# Patient Record
Sex: Female | Born: 1955 | Race: Black or African American | Hispanic: No | Marital: Married | Smoking: Never smoker
Health system: Southern US, Community
[De-identification: ages and names within clinical notes are randomized; demographics above are authoritative.]

## PROBLEM LIST (undated history)

## (undated) DIAGNOSIS — R202 Paresthesia of skin: Secondary | ICD-10-CM

## (undated) HISTORY — PX: NO PAST SURGERIES: SHX2092

## (undated) HISTORY — DX: Paresthesia of skin: R20.2

---

## 2004-09-06 ENCOUNTER — Other Ambulatory Visit: Admission: RE | Admit: 2004-09-06 | Discharge: 2004-09-06 | Payer: Self-pay | Admitting: Obstetrics and Gynecology

## 2004-09-14 ENCOUNTER — Ambulatory Visit: Payer: Self-pay | Admitting: Family Medicine

## 2004-09-14 ENCOUNTER — Ambulatory Visit (HOSPITAL_COMMUNITY): Admission: RE | Admit: 2004-09-14 | Discharge: 2004-09-14 | Payer: Self-pay | Admitting: Obstetrics and Gynecology

## 2004-09-14 IMAGING — US US SOFT TISSUE HEAD/NECK
1 series · 14 of 25 positions shown · non-contrast
Comparison: None.

CLINICAL DATA: Patient has enlarged thyroid gland.
 THYROID ULTRASOUND:
TECHNIQUE: Ultrasound examination of the thyroid gland and adjacent soft tissue structures was performed.

[Series 1: unknown · 0.12mm/px · 14 of 49 slices shown]
[im 1/49]
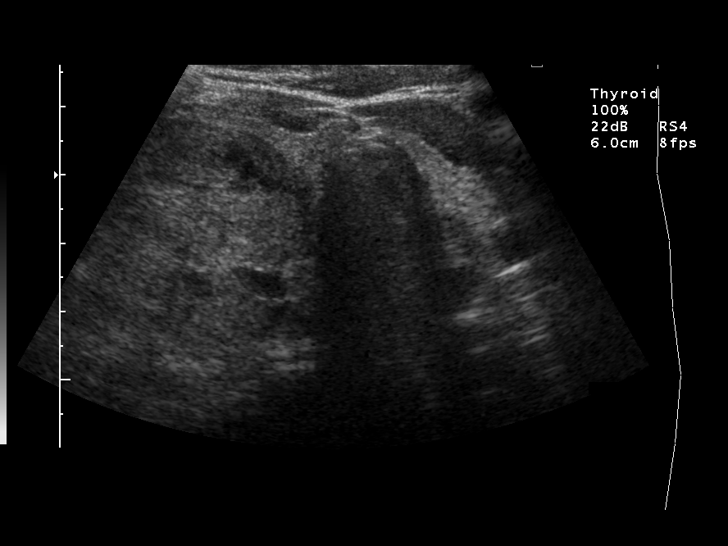
[im 5/49]
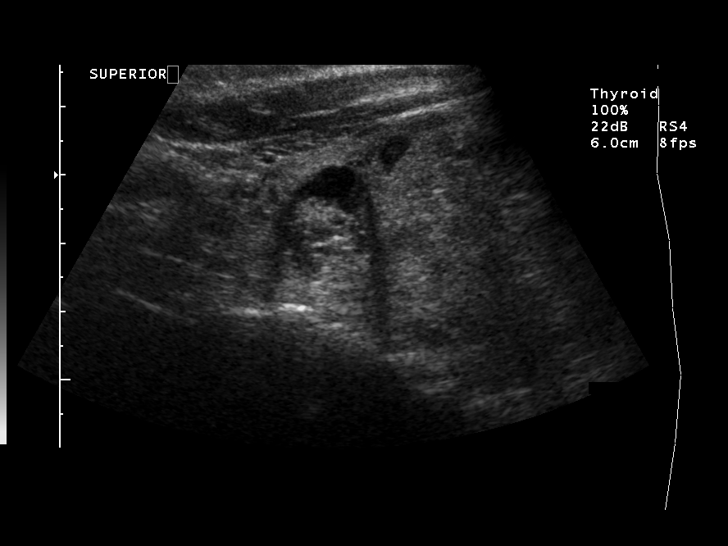
[im 9/49]
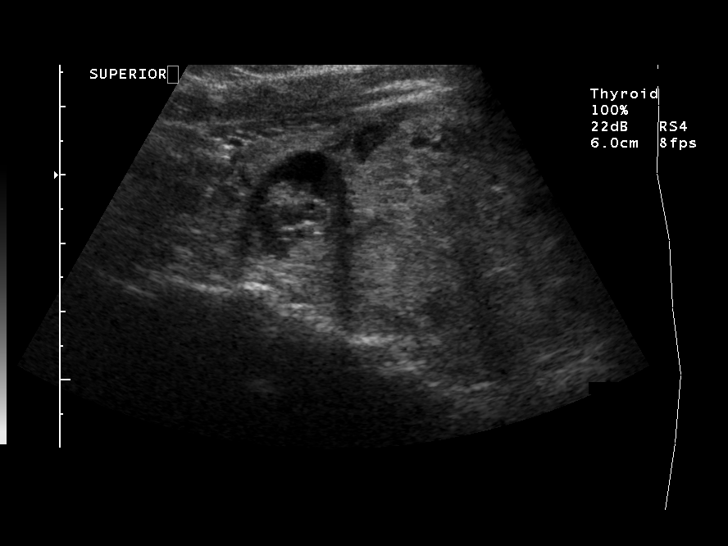
[im 13/49]
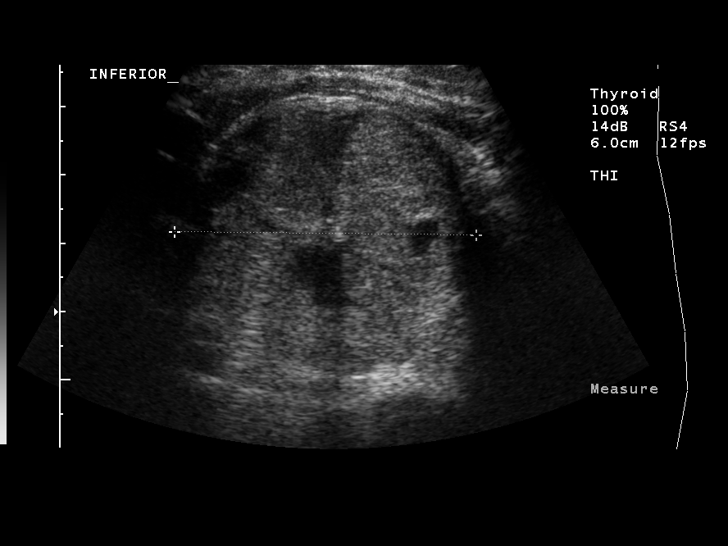
[im 17/49]
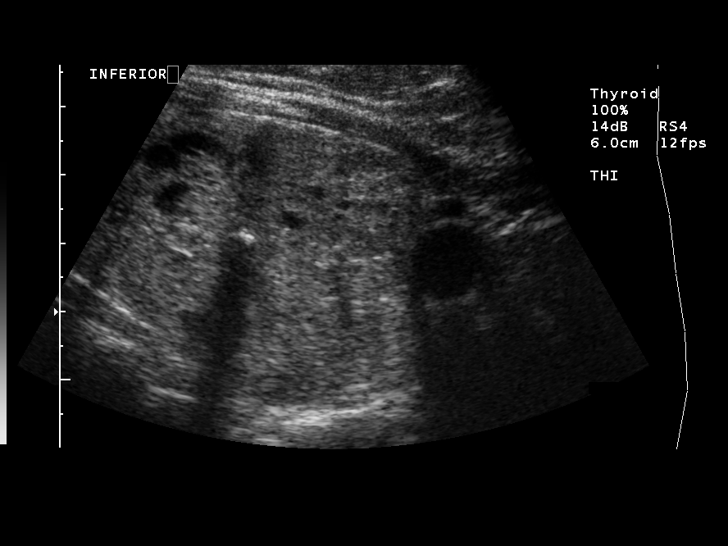
[im 19/49]
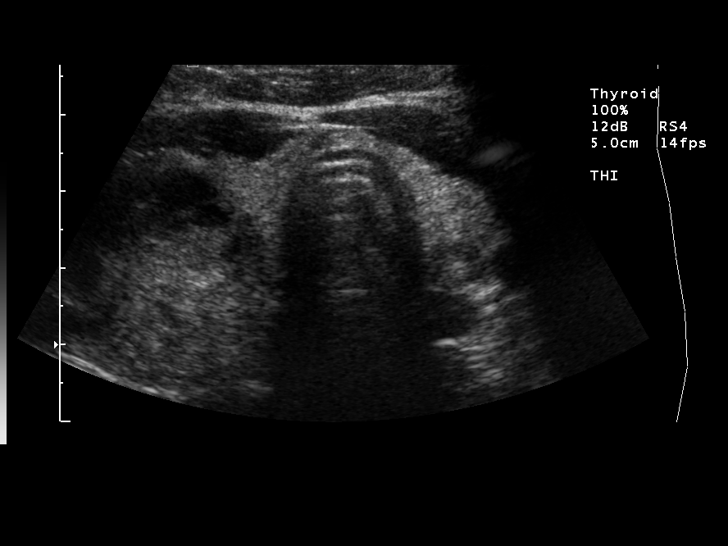
[im 23/49]
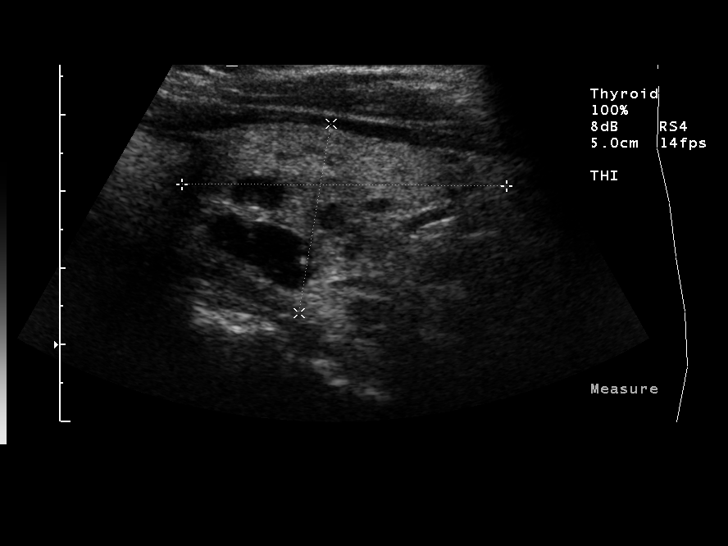
[im 27/49]
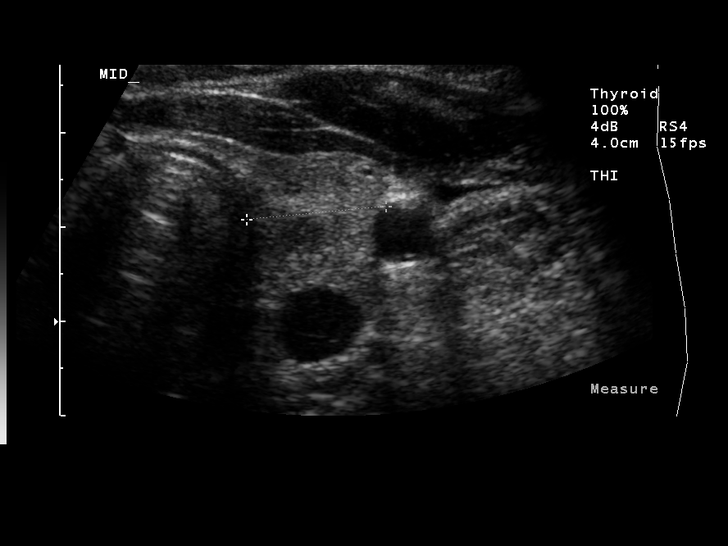
[im 31/49]
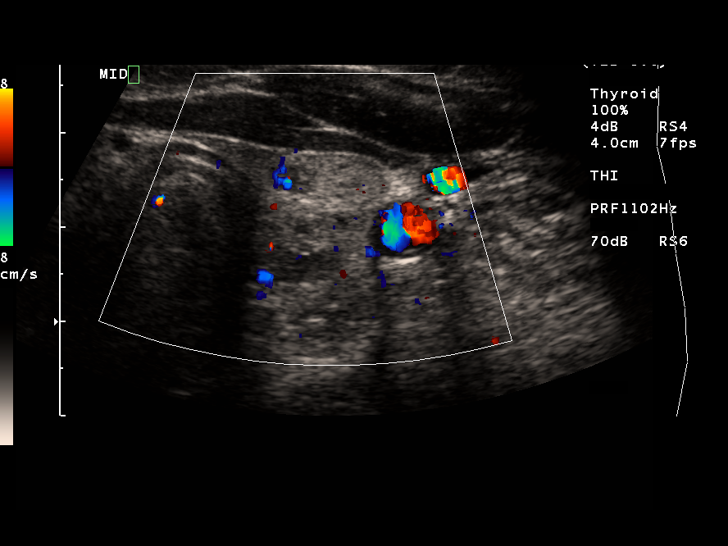
[im 33/49]
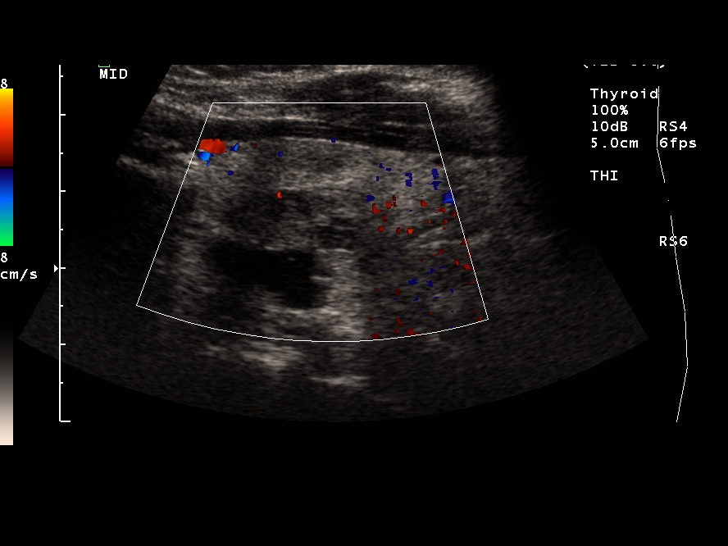
[im 37/49]
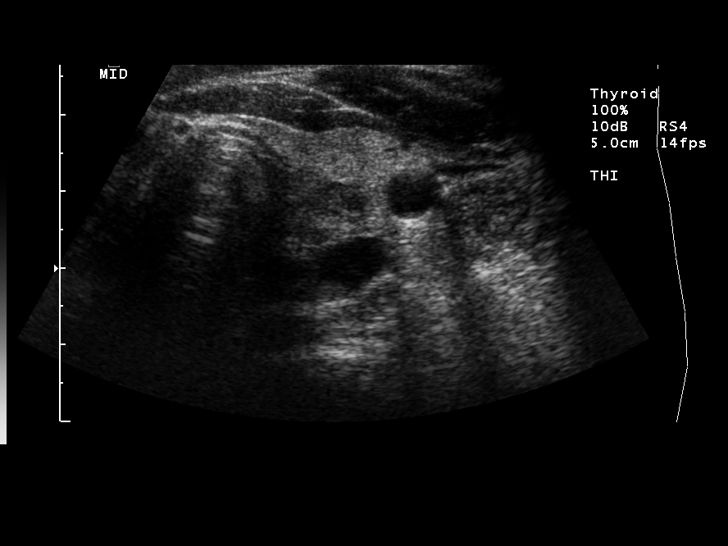
[im 41/49]
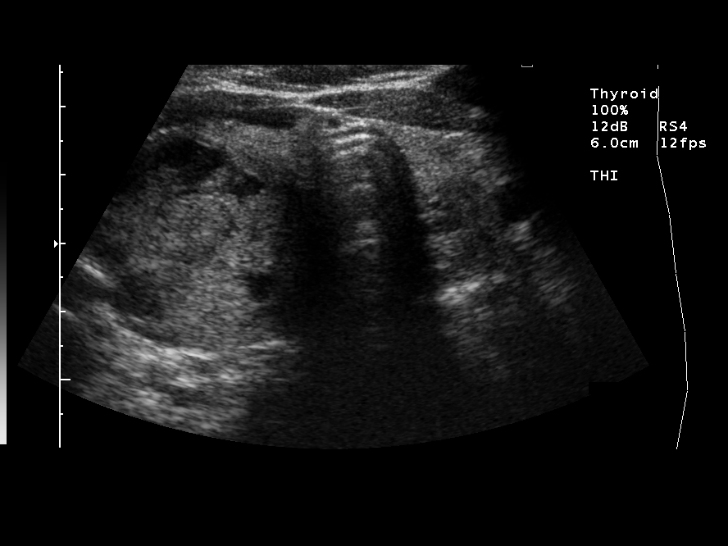
[im 45/49]
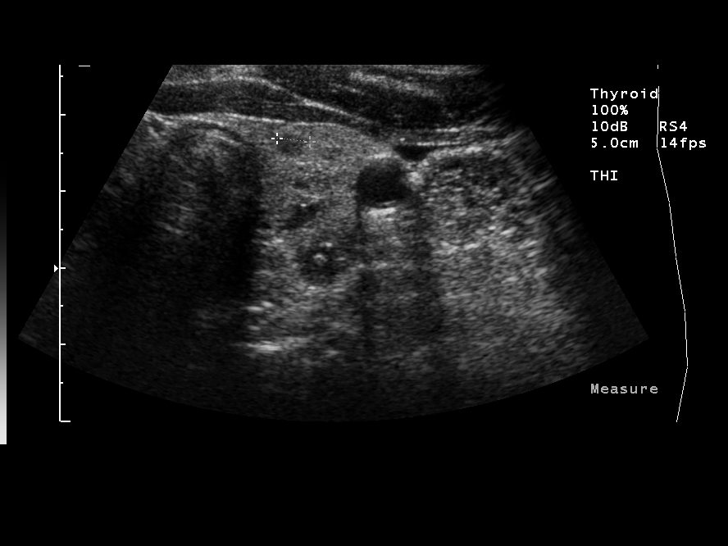
[im 49/49]
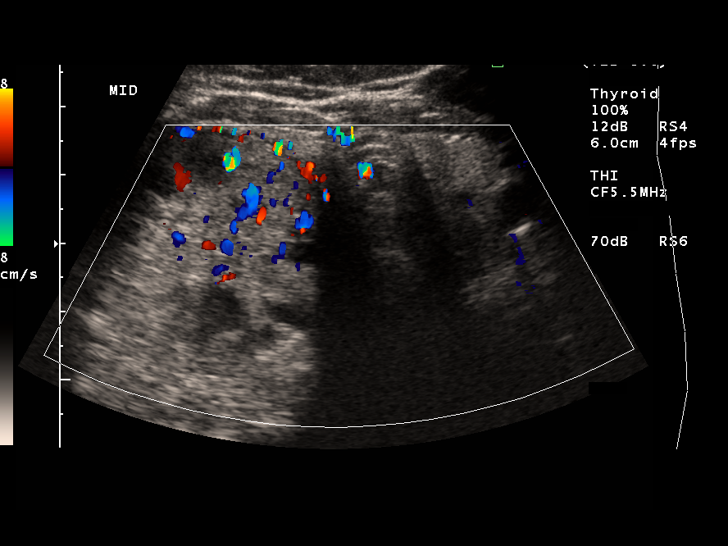

[14 of 25 positions shown; findings below may reference images not displayed]

No labs are available.
 Right lobe measures 6.6 x 4.0 x 4.5 cm and the left lobe measures 4.3 x 2.5 x 1.5 cm. In the right lobe, there is a 6.1 x 4.0 x 4.4 cm mostly solid mass with some areas of degeneration. 
 In the left lobe, there is a 2.6 x 1.6 x 1.4 cm complex mass in the lower pole.  Also in the lower pole, there is 1.8 x 1.0 mainly cystic mass. This is thought to represent a degenerated area in the 2.6 cm mass. In the upper pole on the left, there is a 6 x 3 x 4 mm mostly solid mass.
IMPRESSION: Multinodular goiter.  Biopsy would be suggested of the 6 cm mass in the right lobe, as well as the 2.6 cm mass in the left lobe.

## 2004-09-20 ENCOUNTER — Ambulatory Visit: Payer: Self-pay | Admitting: Family Medicine

## 2004-11-06 ENCOUNTER — Ambulatory Visit (HOSPITAL_COMMUNITY): Admission: RE | Admit: 2004-11-06 | Discharge: 2004-11-06 | Payer: Self-pay | Admitting: Endocrinology

## 2004-11-20 ENCOUNTER — Other Ambulatory Visit: Admission: RE | Admit: 2004-11-20 | Discharge: 2004-11-20 | Payer: Self-pay | Admitting: Interventional Radiology

## 2004-11-20 ENCOUNTER — Encounter: Admission: RE | Admit: 2004-11-20 | Discharge: 2004-11-20 | Payer: Self-pay | Admitting: Endocrinology

## 2004-11-20 ENCOUNTER — Encounter (INDEPENDENT_AMBULATORY_CARE_PROVIDER_SITE_OTHER): Payer: Self-pay | Admitting: *Deleted

## 2004-11-20 IMAGING — US US BIOPSY
1 series · 13 of 13 positions shown · non-contrast
Comparison: 1)  Thyroid Ultrasound, [DATE] ? [HOSPITAL]
     2)  Nuclear Medicine Thyroid Scan ? [DATE] ? [HOSPITAL]

CLINICAL DATA: Bilateral thyroid nodules.  Request to perform fine needle aspiration. 
 ULTRASOUND GUIDED FINE NEEDLE ASPIRATION, RIGHT LOBE OF THYROID:

[Series 1: unknown · 0.09mm/px · 13 of 13 slices shown]
[im 1/13]
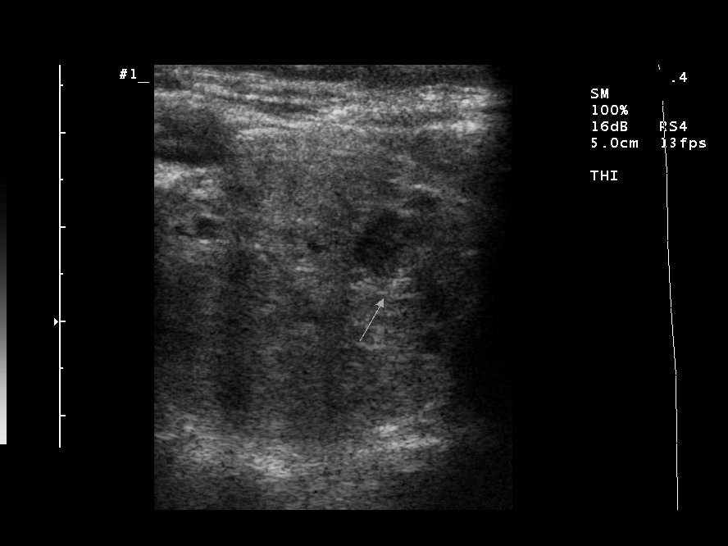
[im 2/13]
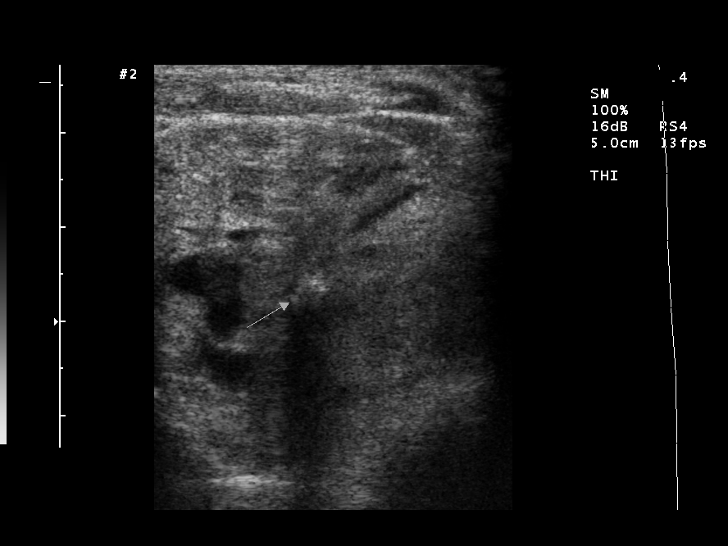
[im 3/13]
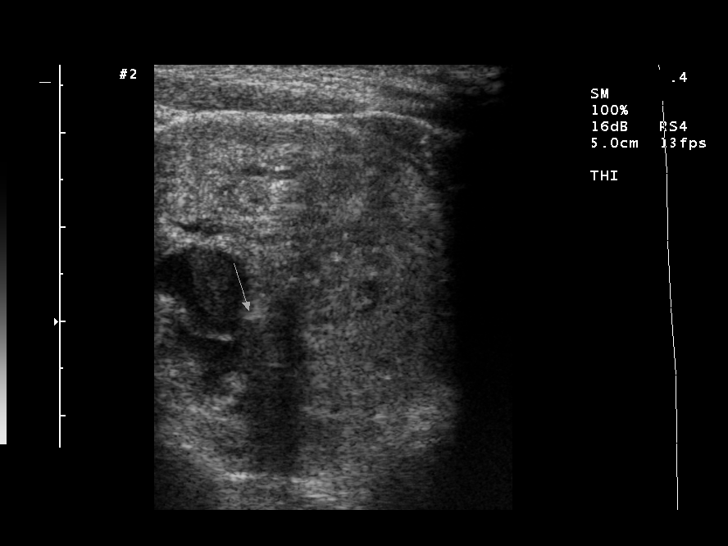
[im 4/13]
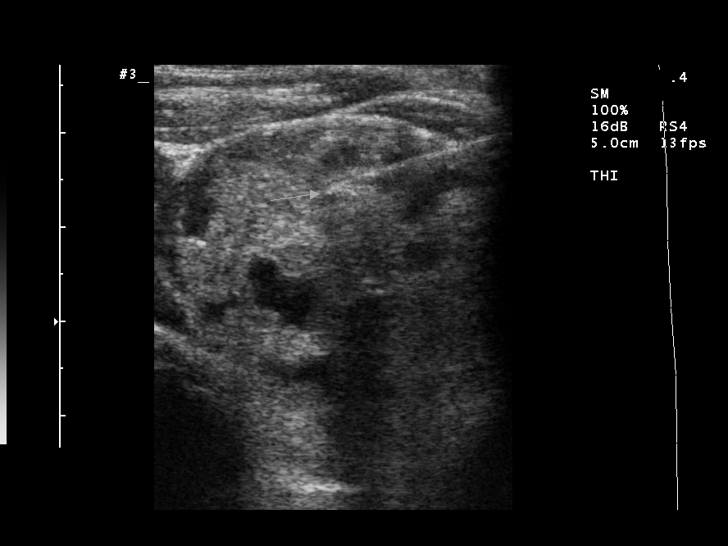
[im 5/13]
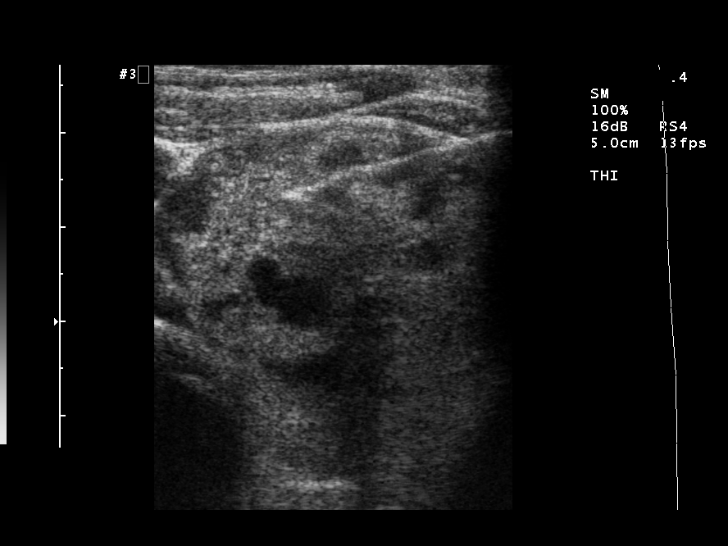
[im 6/13]
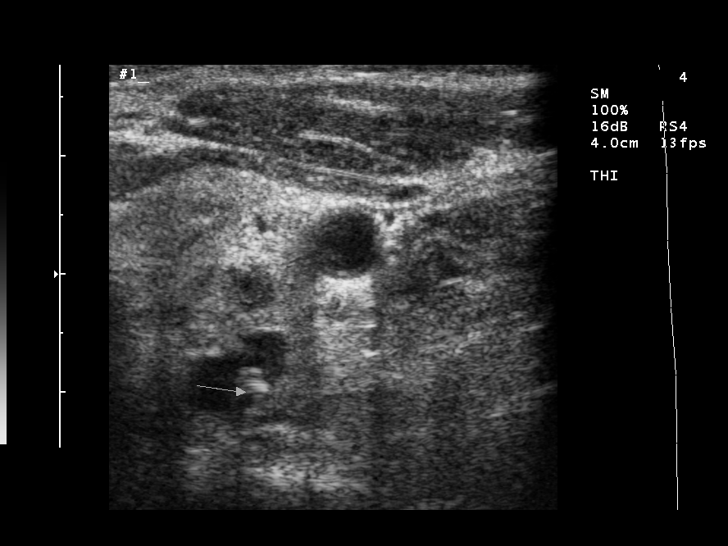
[im 7/13]
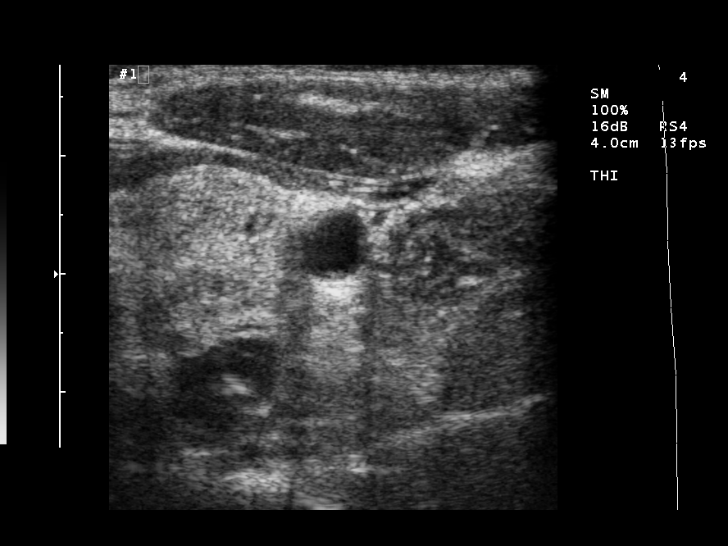
[im 8/13]
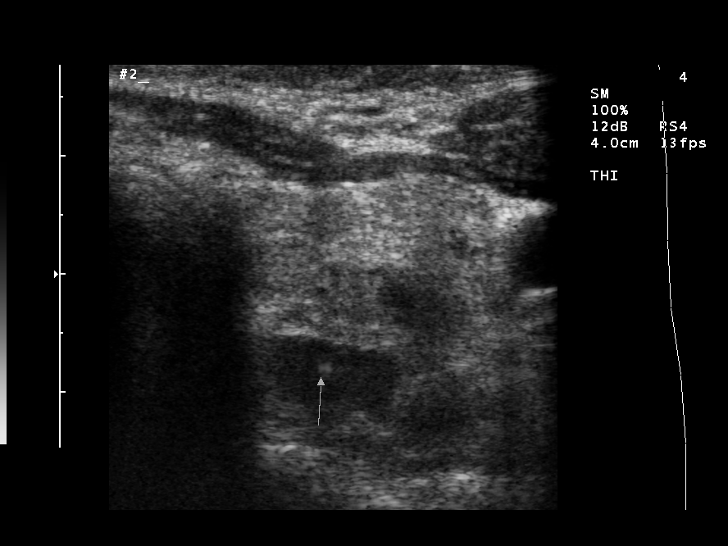
[im 9/13]
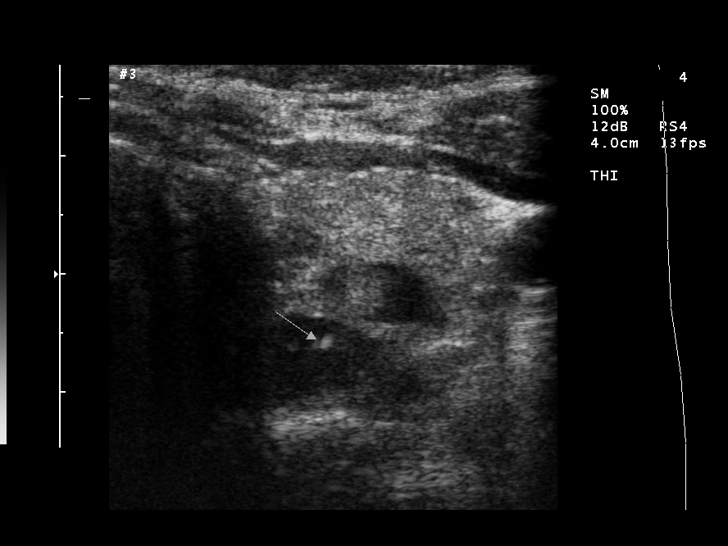
[im 10/13]
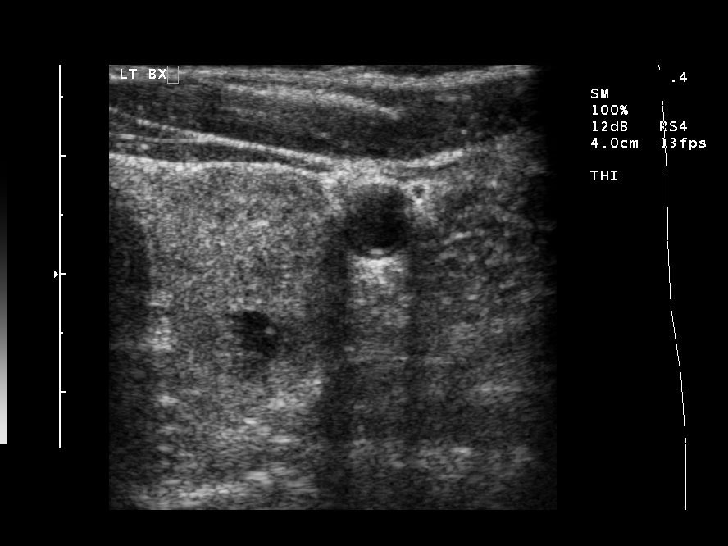
[im 11/13]
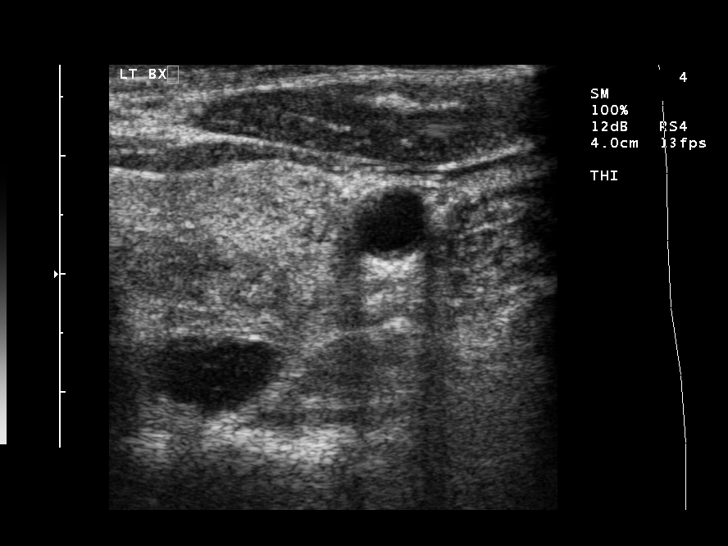
[im 12/13]
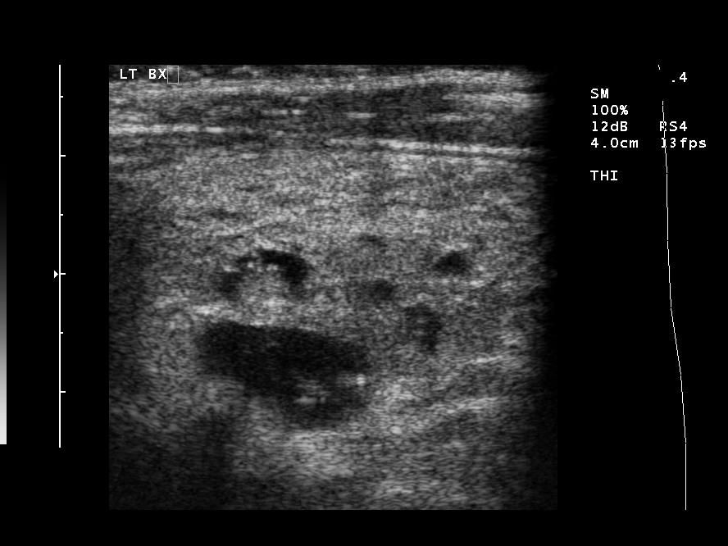
[im 13/13]
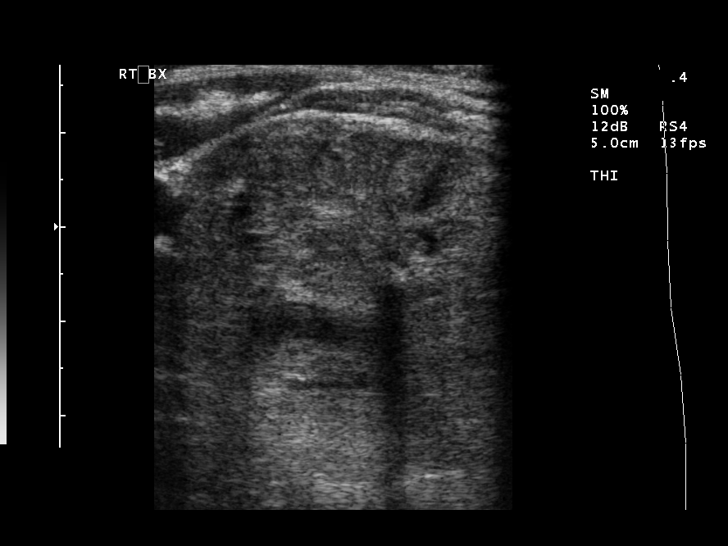

[13 of 13 positions shown; findings below may reference images not displayed]

FINDINGS: The above procedure was thoroughly discussed with the patient and written informed consent was obtained.
 Ultrasound was then performed to localize and mark an adequate site for the biopsy.  The patient was then prepped and draped in a normal sterile fashion, 1% Lidocaine was used for local anesthesia.  Using direct ultrasound guidance, three passes were made using a 25 gauge hypodermic needle into the large nodule located within the right lobe of the thyroid.  Ultrasound confirmed placement of the needle on all three occasions. Microcalcifications are noted within this nodule.  
 ULTRASOUND GUIDED FINE NEEDLE ASPIRATION, LEFT LOBE OF THYROID:
 Ultrasound was then performed to localize and mark an adequate site for the biopsy.  The patient was then prepped and draped in a normal sterile fashion, 1% Lidocaine was used for local anesthesia.  Using direct ultrasound guidance, three passes were made using a 25 gauge hypodermic needle into the complex nodule located within the left lobe of the thyroid.  Ultrasound confirmed placement of the needle on all three occasions.   Several aspirations were attempted of the more cystic component of this complex nodule.   I was unable to aspirate this cystic component in its entirety due to the consistency of this cyst. 
 The specimens were given to pathology for further analysis.  Post procedure imaging demonstrated no hematoma or immediate complication. The patient tolerated the procedure well.
IMPRESSION: 1.  Successful ultrasound guided fine needle aspiration, large nodule, right lobe of the thyroid.  Final pathology pending. 
 2.  Successful ultrasound guided fine needle aspiration, complex nodule, left lobe of the thyroid.  Final pathology pending.

## 2005-05-03 ENCOUNTER — Ambulatory Visit: Payer: Self-pay | Admitting: Family Medicine

## 2005-05-10 ENCOUNTER — Ambulatory Visit: Payer: Self-pay | Admitting: Family Medicine

## 2009-02-15 ENCOUNTER — Ambulatory Visit (HOSPITAL_BASED_OUTPATIENT_CLINIC_OR_DEPARTMENT_OTHER): Admission: RE | Admit: 2009-02-15 | Discharge: 2009-02-15 | Payer: Self-pay | Admitting: Family Medicine

## 2009-02-15 ENCOUNTER — Ambulatory Visit: Payer: Self-pay | Admitting: Diagnostic Radiology

## 2009-02-15 IMAGING — US US SOFT TISSUE HEAD/NECK
1 series · 13 of 25 positions shown · non-contrast
Comparison: Ultrasound of the thyroid of [DATE]

CLINICAL DATA: Follow up of thyroid nodules

THYROID ULTRASOUND
TECHNIQUE: Ultrasound examination of the thyroid gland and
adjacent soft tissues was performed.

[Series 1: us soft tissue head/neck · 0.10mm/px · 13 of 64 slices shown]
[im 1/64]
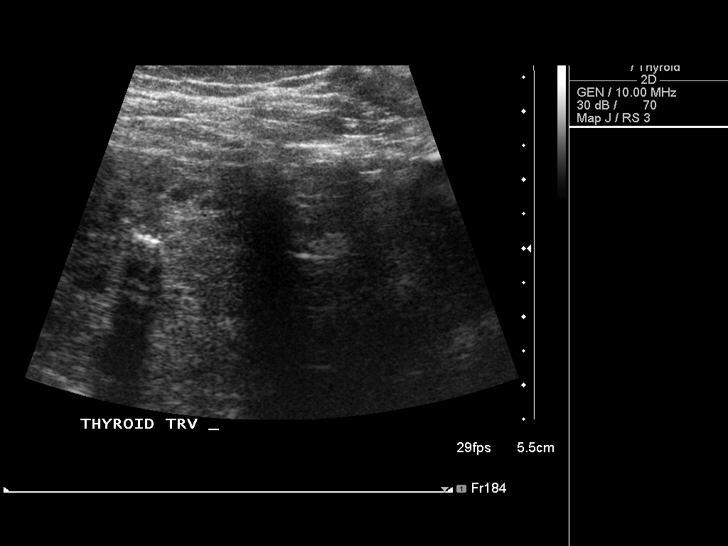
[im 6/64]
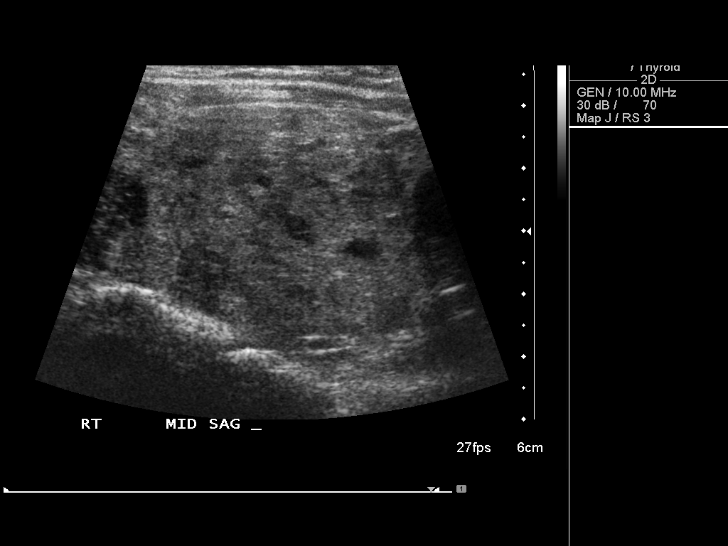
[im 11/64]
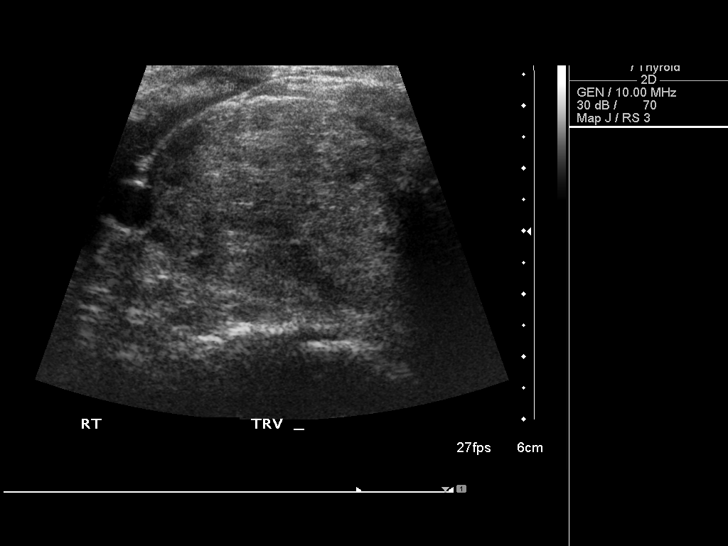
[im 16/64]
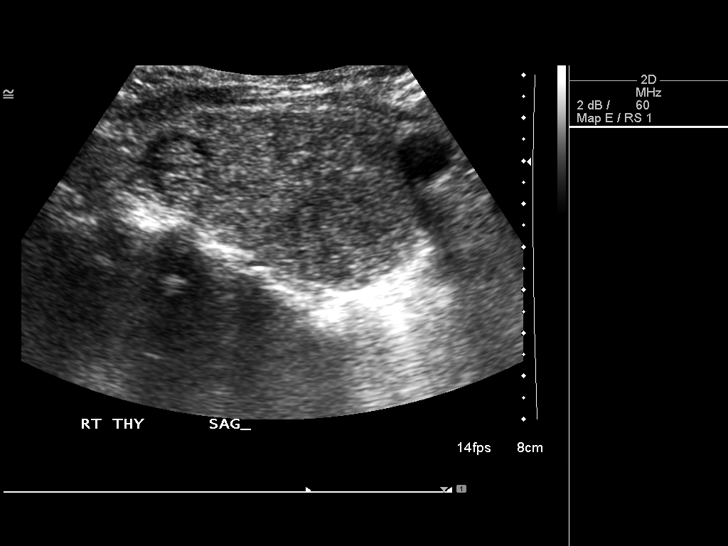
[im 22/64]
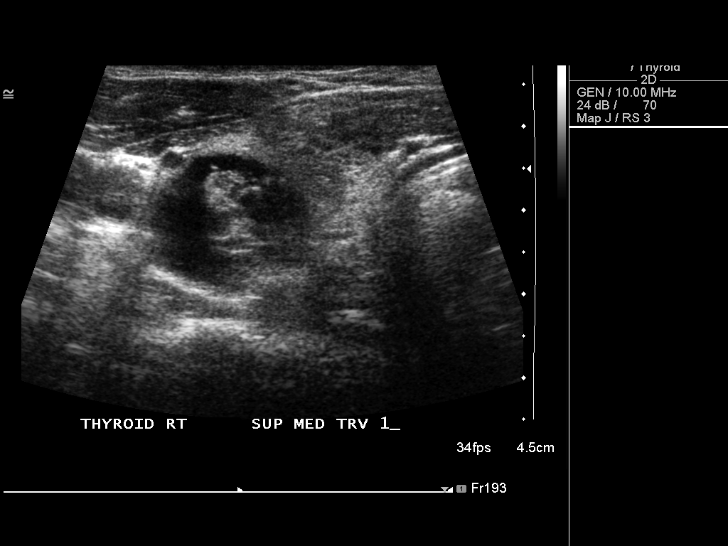
[im 27/64]
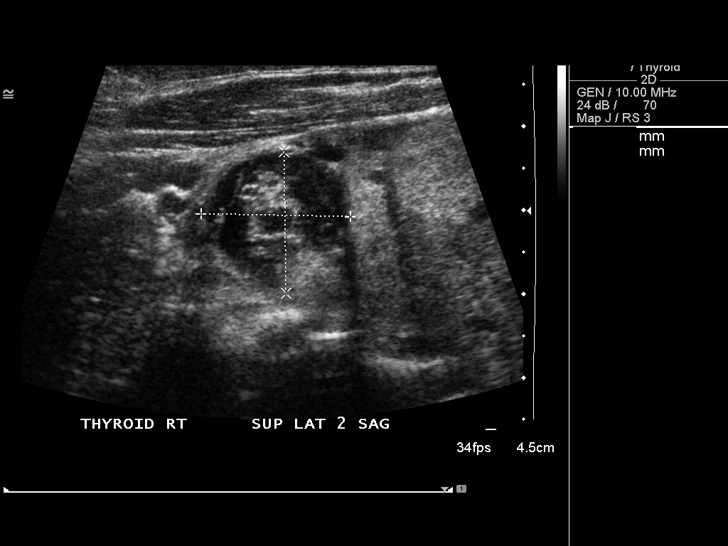
[im 32/64]
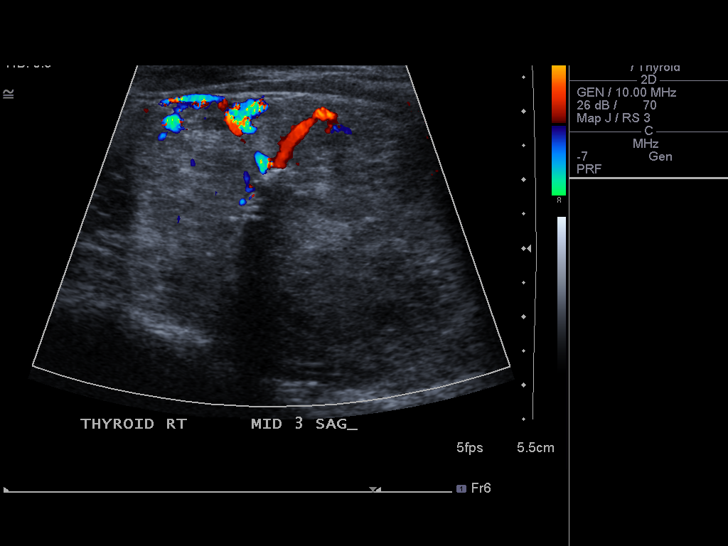
[im 37/64]
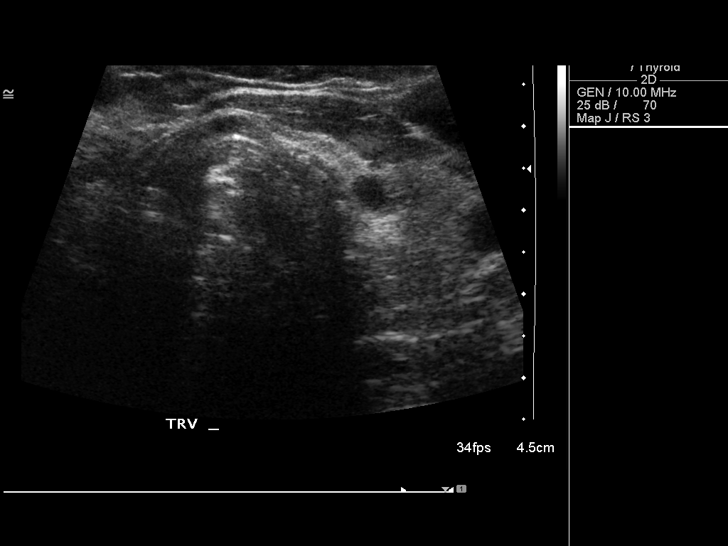
[im 43/64]
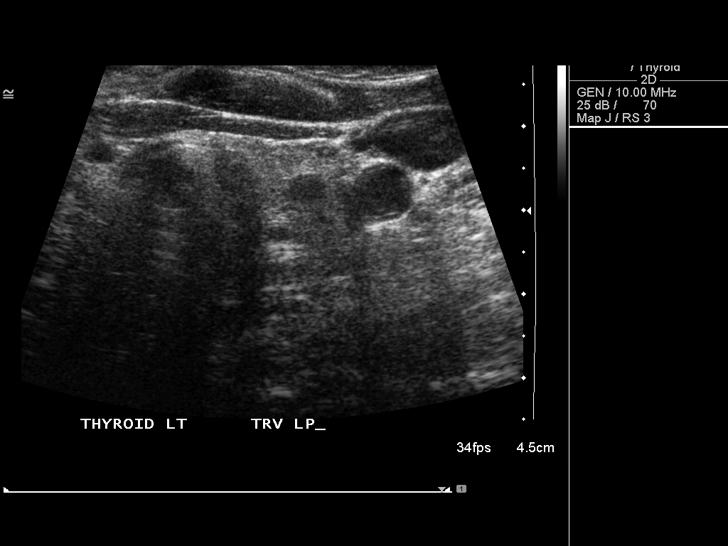
[im 48/64]
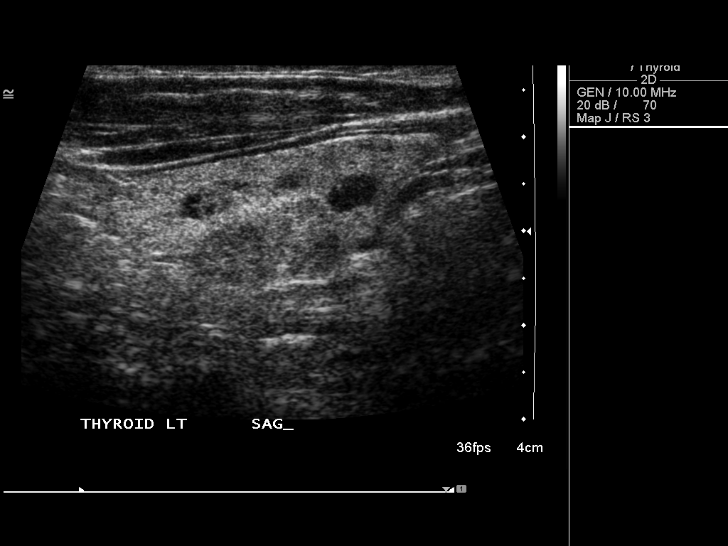
[im 53/64]
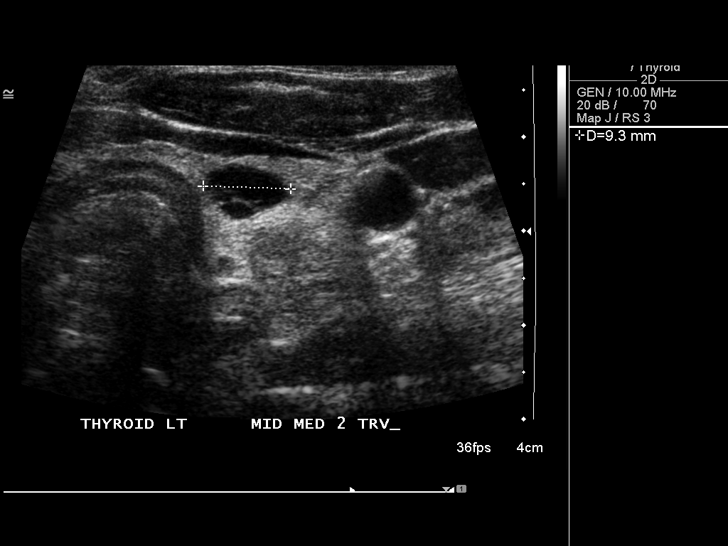
[im 58/64]
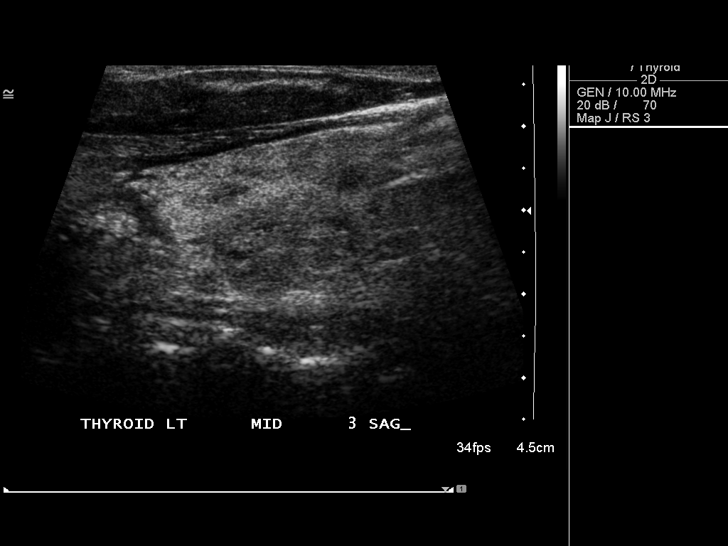
[im 64/64]
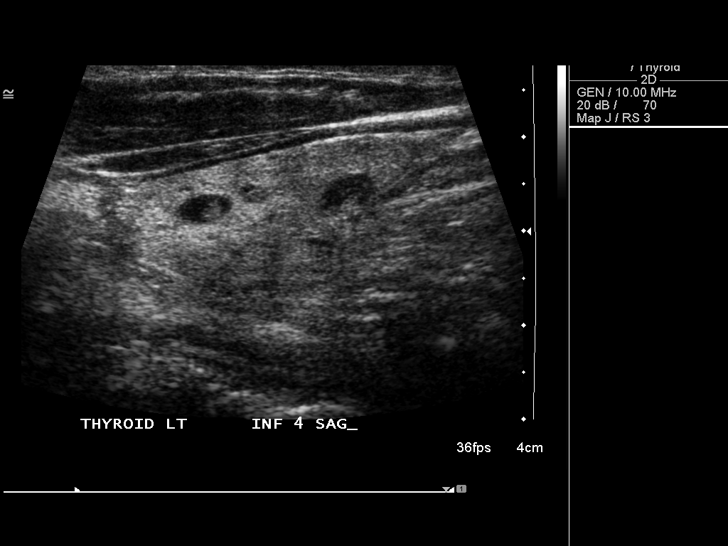

[13 of 25 positions shown; findings below may reference images not displayed]

FINDINGS: The thyroid gland has not changed significantly in size.
The right lobe measures 7.0 cm sagittally, with a depth of 4.2 cm
and width of 4.3 cm.  (Prior measurements of 6.6 x 4.0 x 4.5 cm).
The left lobe currently measures 3.9 x 1.5 x 1.7 cm with the
isthmus measuring 2 mm.  (Prior measurements were 4.2 x 2.5 x
cm with the isthmus measuring 3 mm).

The dominant nodule in the lower pole on the right has not changed
significantly in size measuring 5.1 x 4.5 x 4.1 cm with some
calcification present. A nodule just above this dominant nodule may
have been measured in combination with that dominant nodule on the
prior study, and that nodule measures 1.8 x 2.0 x 1.7 cm currently.
A new nodule is noted in the upper pole medially measuring 2.1 x
1.5 x 0.8 cm and is complex.

On the left there is little change in the previously noted nodule.
Smaller nodules of no more than 8 mm in size are noted in the upper
and lower portions of the left lobe.
IMPRESSION: There are new small nodules present bilaterally with little change
in the previously noted thyroid nodules.  This pattern again is
most consistent with multinodular goiter.

## 2009-02-18 ENCOUNTER — Encounter: Admission: RE | Admit: 2009-02-18 | Discharge: 2009-02-18 | Payer: Self-pay | Admitting: Family Medicine

## 2009-02-18 IMAGING — MG MM DIGITAL SCREENING
4 series · 4 of 4 positions shown · non-contrast
Comparison: none

DG SCREEN MAMMOGRAM BILATERAL
Bilateral CC and MLO view(s) were taken.

DIGITAL SCREENING MAMMOGRAM WITH CAD:
There are scattered fibroglandular densities.  No masses or malignant type calcifications are 
identified.  Compared with prior studies.
Images were processed with CAD.

[R CC]
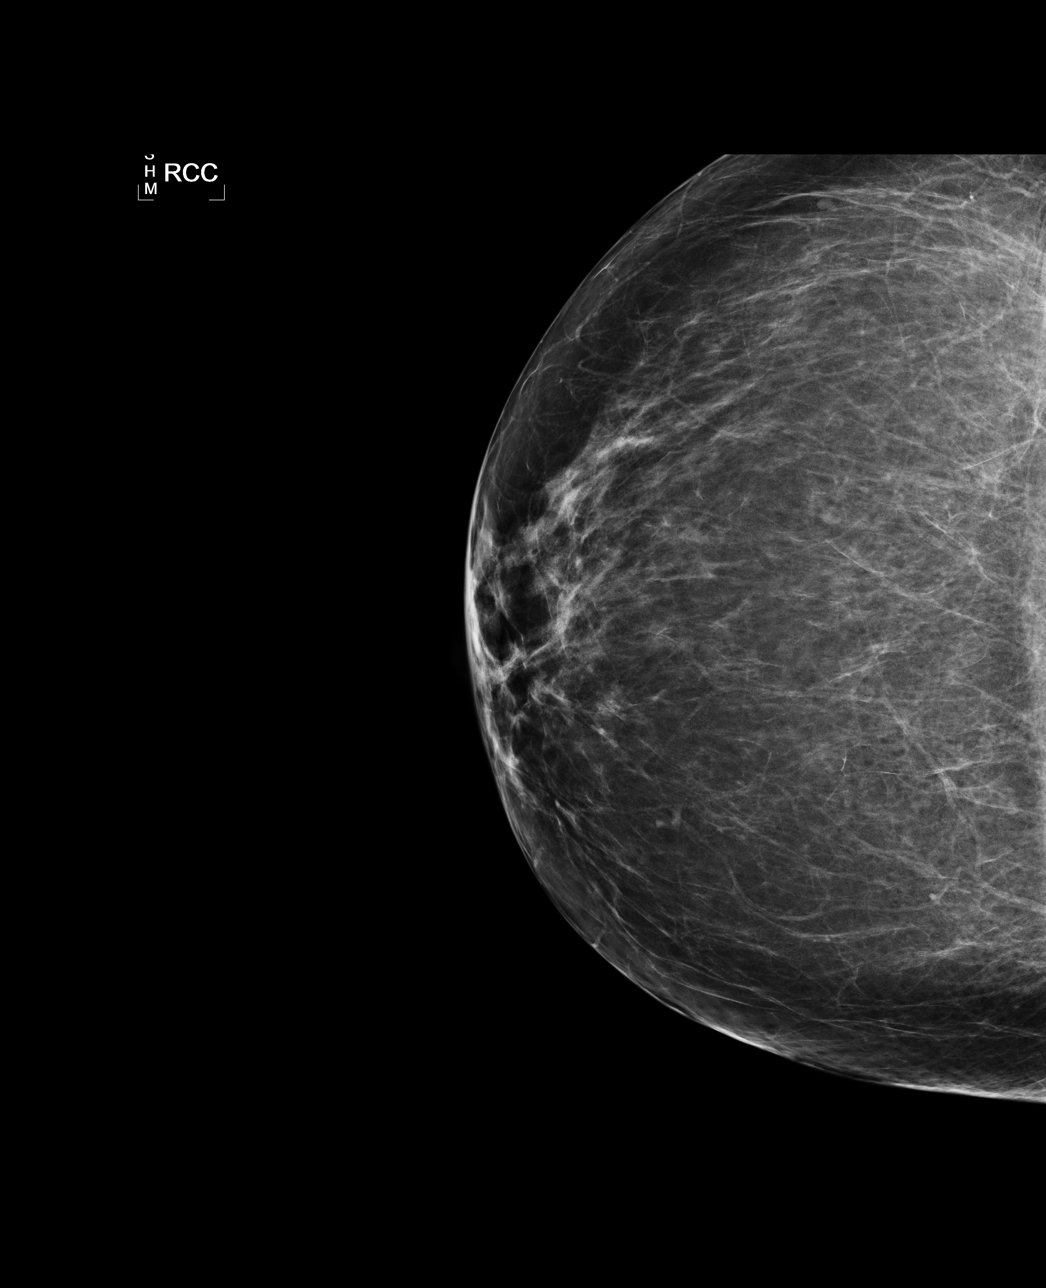

[L CC]
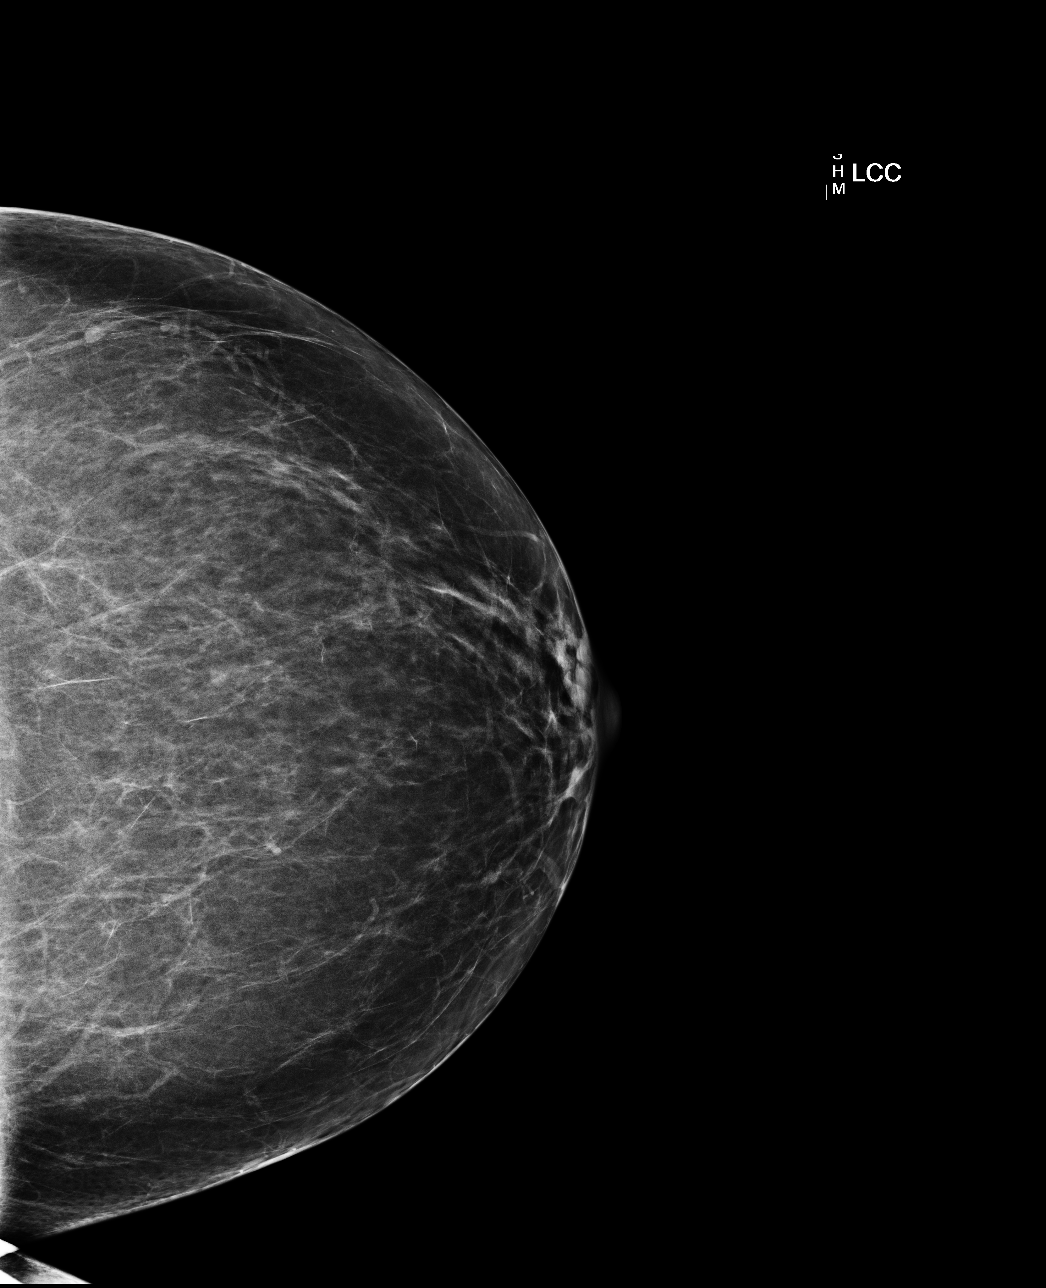

[L MLO]
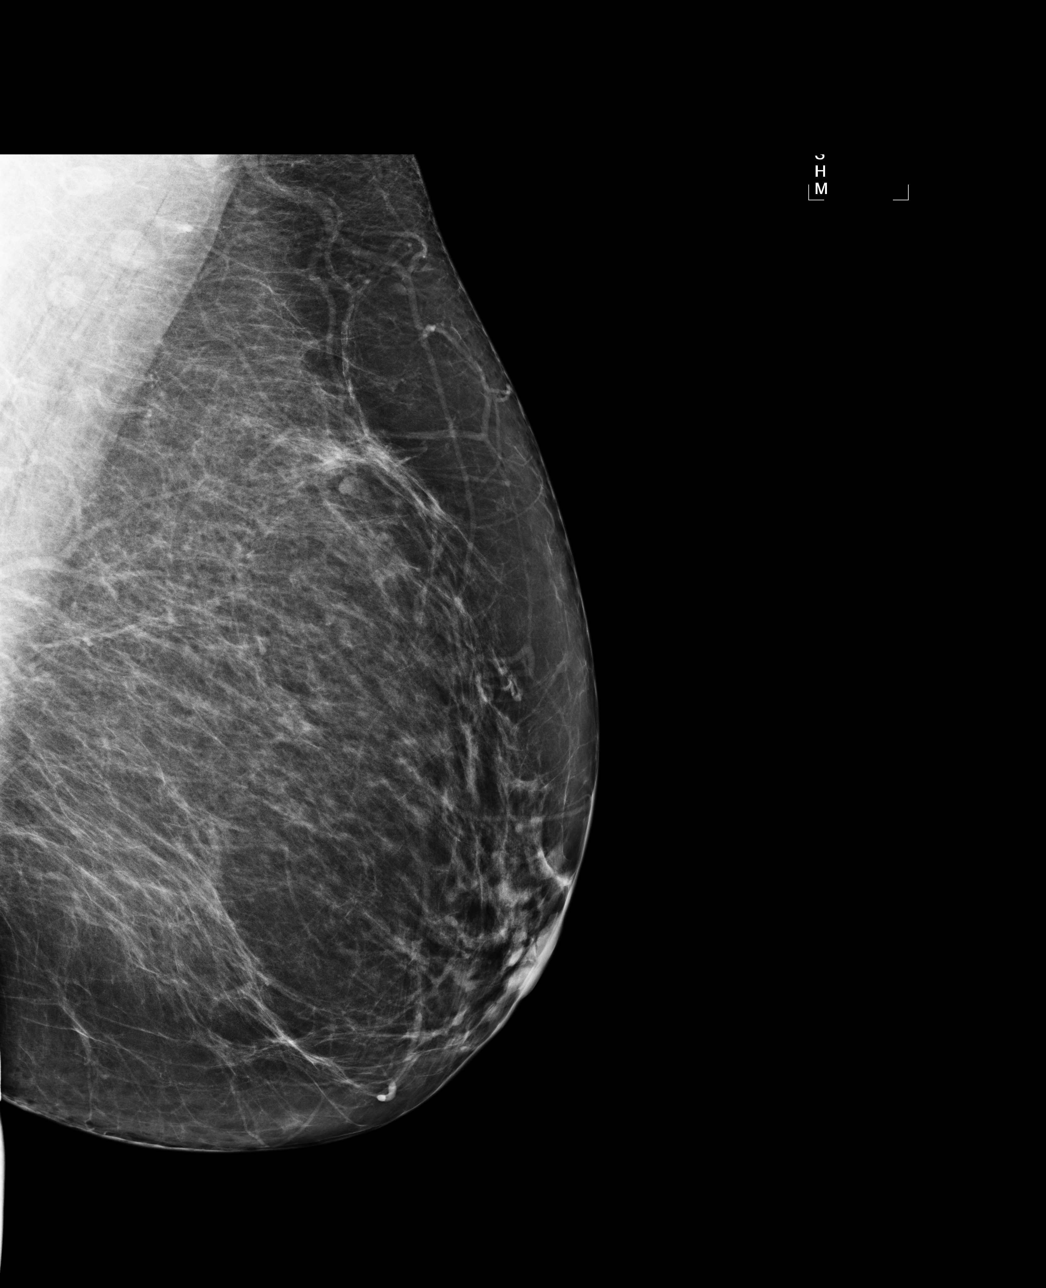

[R MLO]
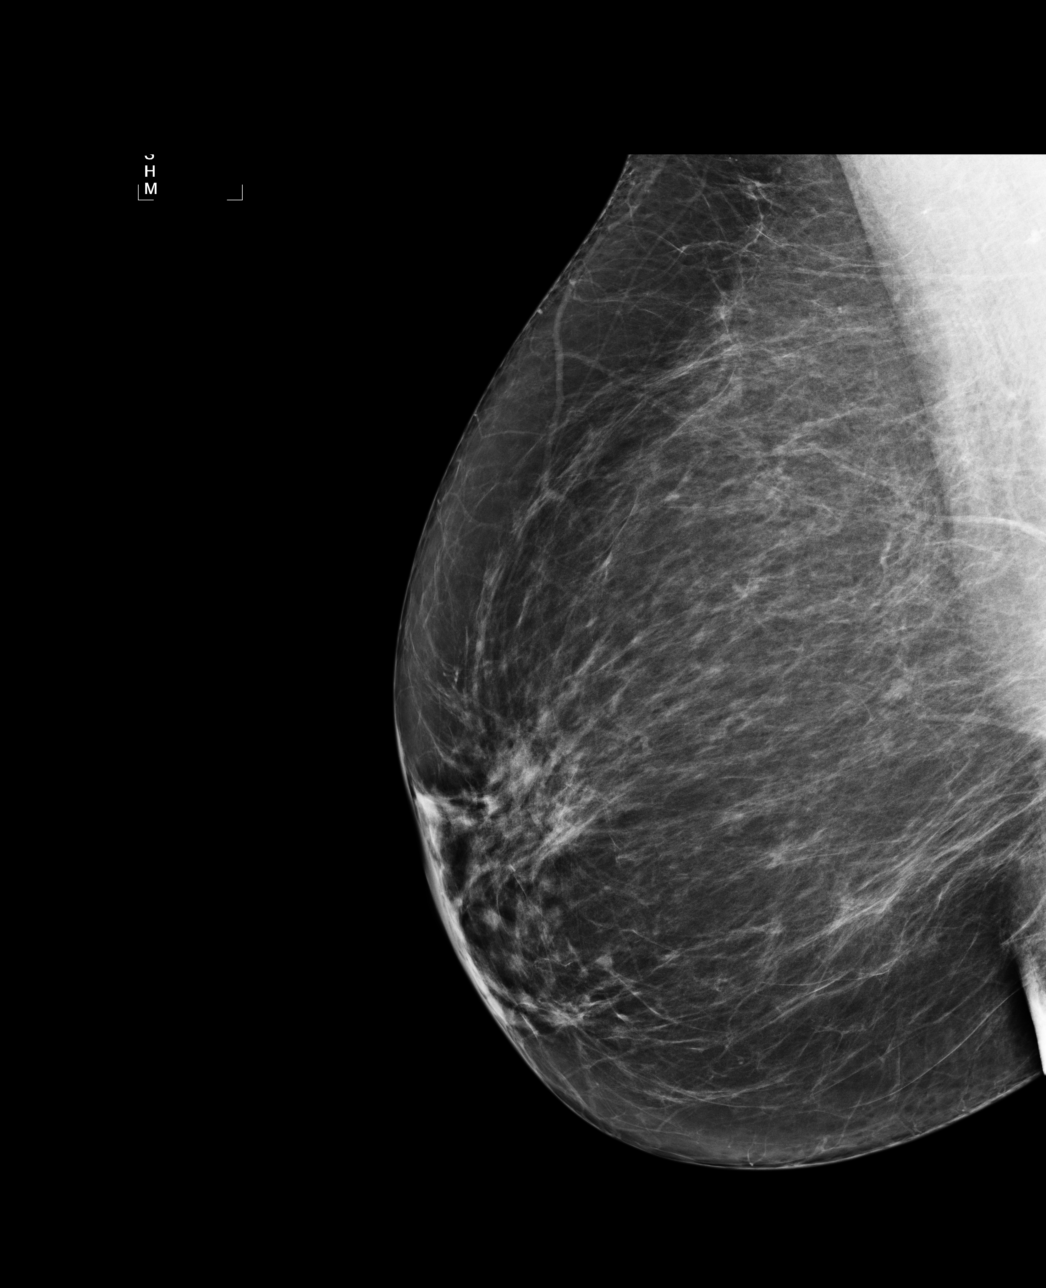

[4 of 4 positions shown; findings below may reference images not displayed]

IMPRESSION: No specific mammographic evidence of malignancy.  Next screening mammogram is recommended in one 
year.

A result letter of this screening mammogram will be mailed directly to the patient.

ASSESSMENT: Negative - BI-RADS 1

Screening mammogram in 1 year.
,

## 2009-05-11 ENCOUNTER — Encounter (HOSPITAL_COMMUNITY): Admission: RE | Admit: 2009-05-11 | Discharge: 2009-07-26 | Payer: Self-pay | Admitting: Internal Medicine

## 2010-04-16 ENCOUNTER — Encounter (HOSPITAL_BASED_OUTPATIENT_CLINIC_OR_DEPARTMENT_OTHER): Payer: Self-pay | Admitting: General Surgery

## 2010-04-16 ENCOUNTER — Encounter: Payer: Self-pay | Admitting: Internal Medicine

## 2012-02-11 ENCOUNTER — Other Ambulatory Visit: Payer: Self-pay | Admitting: Internal Medicine

## 2012-02-11 DIAGNOSIS — E041 Nontoxic single thyroid nodule: Secondary | ICD-10-CM

## 2012-02-15 ENCOUNTER — Ambulatory Visit
Admission: RE | Admit: 2012-02-15 | Discharge: 2012-02-15 | Disposition: A | Discharge status: Home / Self Care | Payer: Federal, State, Local not specified - PPO | Source: Ambulatory Visit | Attending: Internal Medicine | Admitting: Internal Medicine

## 2012-02-15 DIAGNOSIS — E041 Nontoxic single thyroid nodule: Secondary | ICD-10-CM

## 2012-02-15 IMAGING — US US SOFT TISSUE HEAD/NECK
1 series · 13 of 25 positions shown · non-contrast
Comparison: Ultrasound of the thyroid [DATE]

CLINICAL DATA: Follow up of thyroid nodule

THYROID ULTRASOUND
TECHNIQUE: Ultrasound examination of the thyroid gland and adjacent
soft tissues was performed.

[Series 1: us soft tissue head/neck · 0.14mm/px · 13 of 72 slices shown]
[im 1/72]
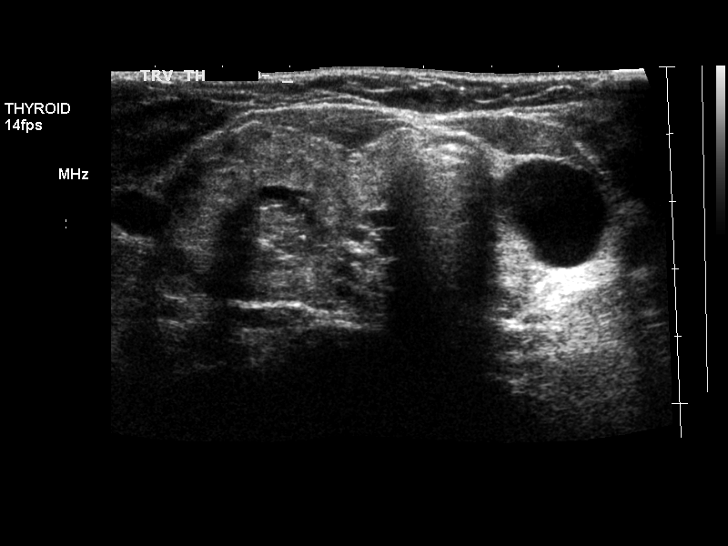
[im 6/72]
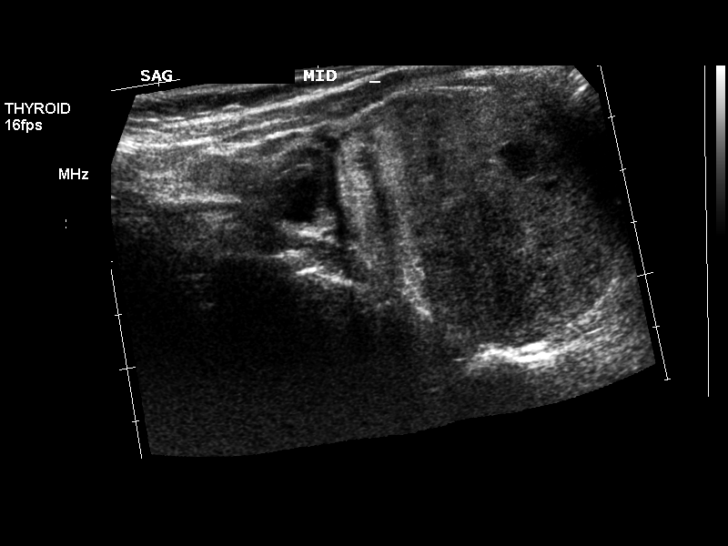
[im 12/72]
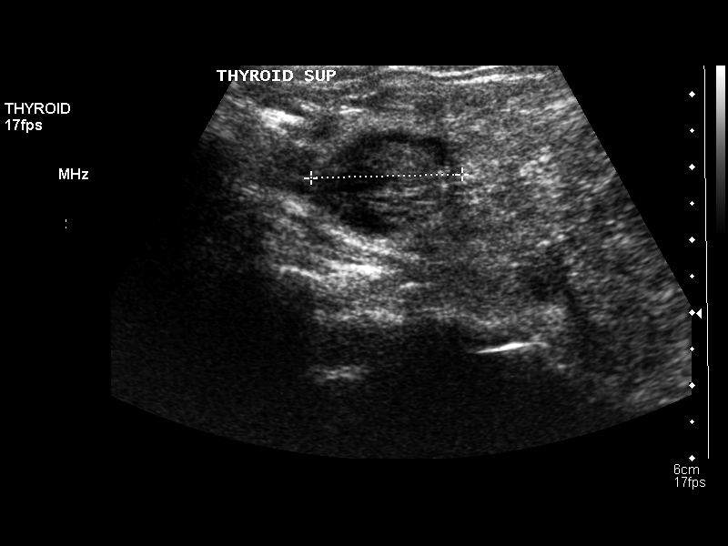
[im 18/72]
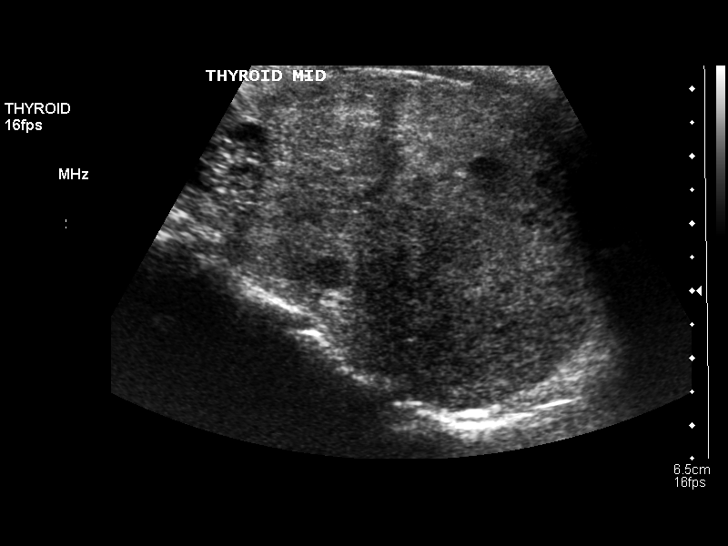
[im 24/72]
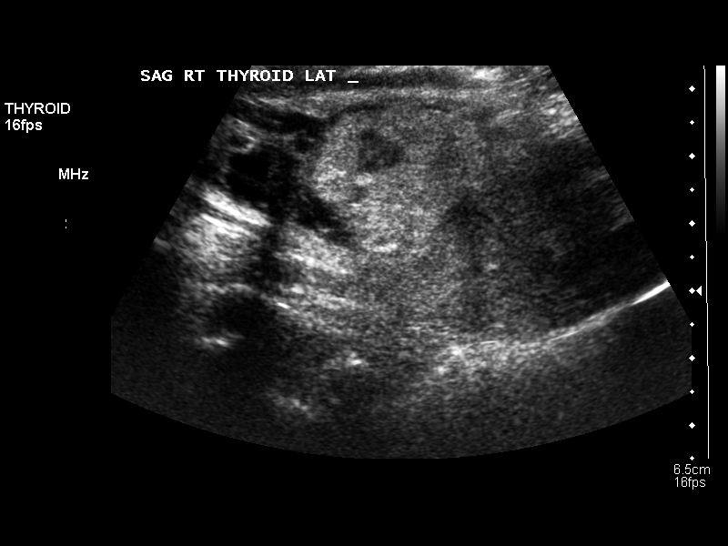
[im 30/72]
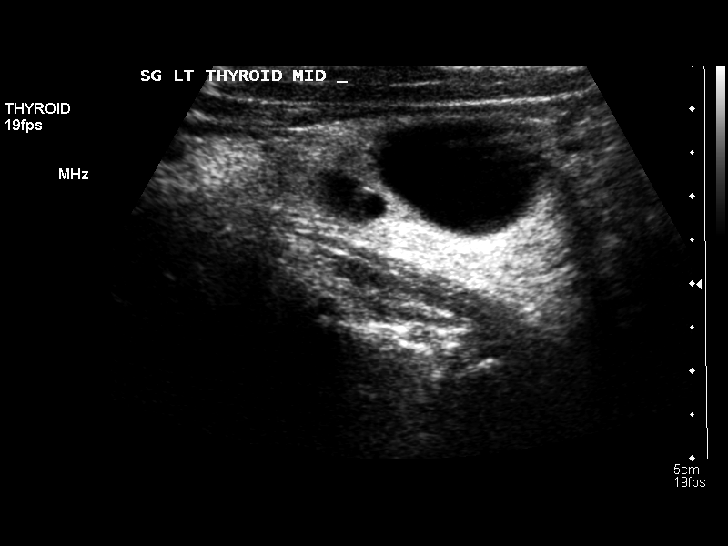
[im 36/72]
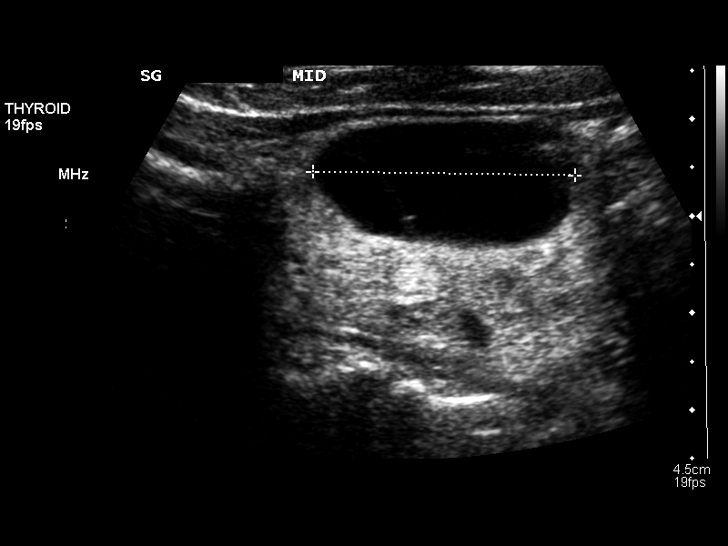
[im 42/72]
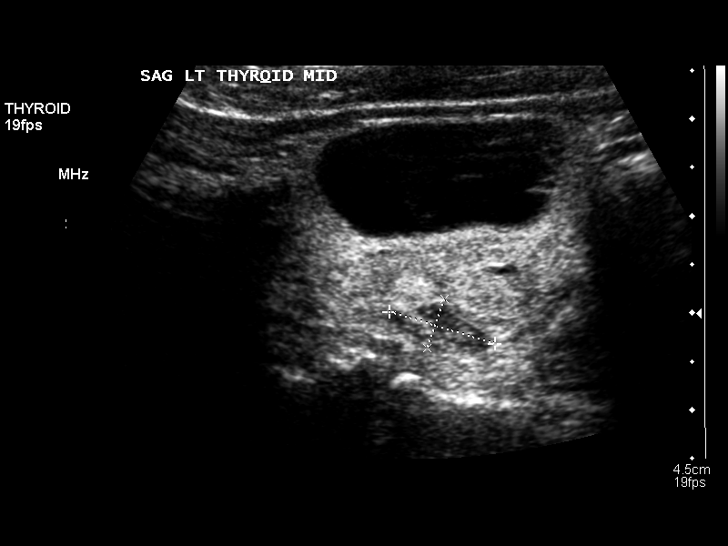
[im 48/72]
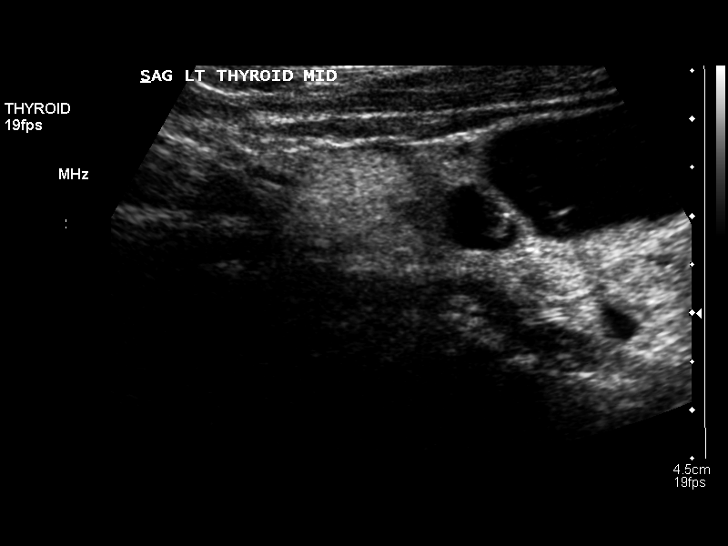
[im 54/72]
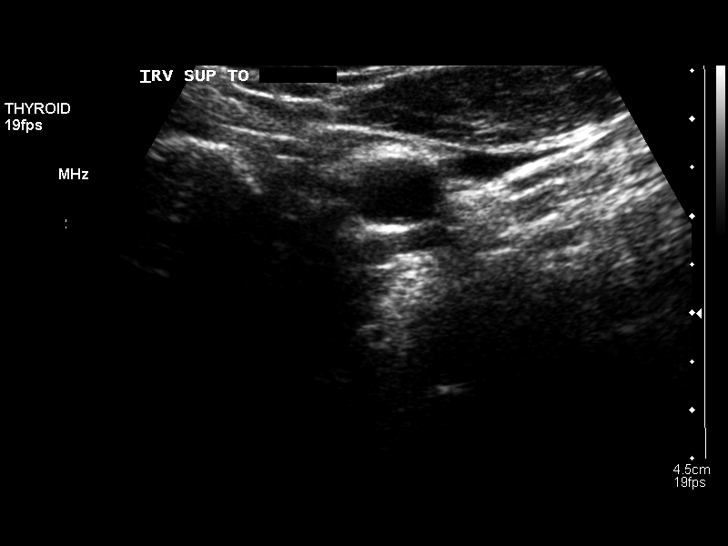
[im 60/72]
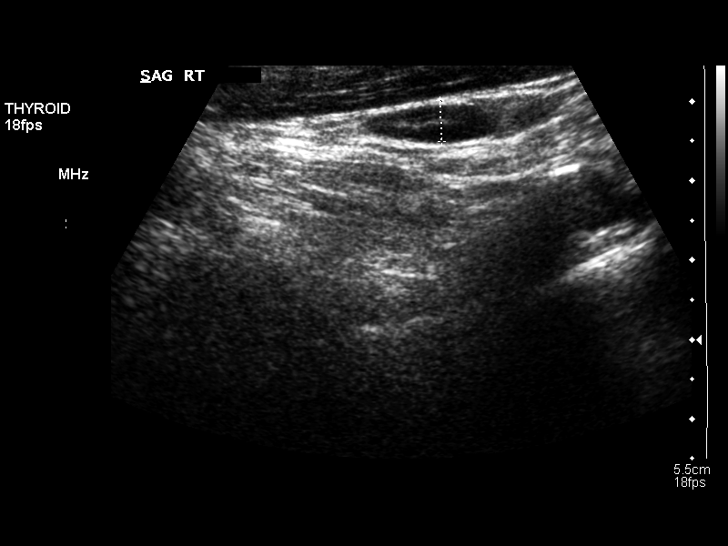
[im 66/72]
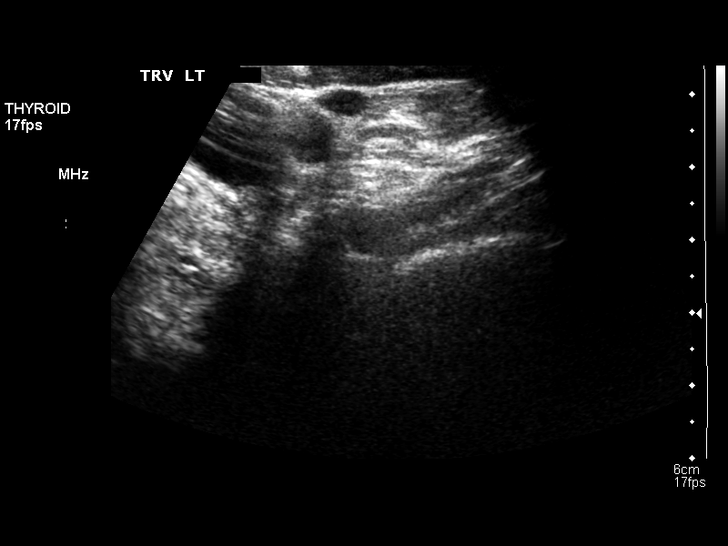
[im 72/72]
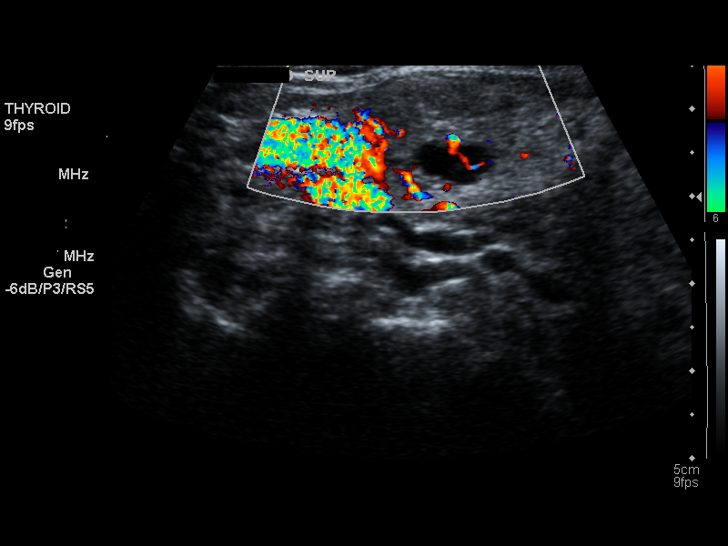

[13 of 25 positions shown; findings below may reference images not displayed]

FINDINGS: Right thyroid lobe:  7.5 x 4.9 x 4.4 cm.  (Previously 7.0 x 4.2 x
4.3 cm).
Left thyroid lobe:  4.6 x 2.0 x 2.3 cm.  (Previously 3.9 x 1.5 x
1.7 cm).
Isthmus:  1.8 mm in thickness.

Focal nodules:  The echogenicity of the thyroid gland is
inhomogeneous.  There are multiple nodules bilaterally.  The
largest solid nodule is in the right mid lower thyroid measuring
4.9 x 5.9 x 4.8 cm compared to prior measurements of 4.5 x 5.1 x
4.1 cm.  A solid nodule in the upper pole on the right measures
x 1.3 x 1.7 cm compared to prior measurements of 1.8 x 2.0 x
cm.  A complex nodule in the upper pole on the right measures 2.7 x
1.6 x 2.1 cm with prior measurements of 1.7 x 2.0 x 1.8 cm.

On the left there is a cyst in the mid left lobe of 2.6 x 1.3 x
cm compared to prior measurement of 1.4 x 0.6 x 0.9 cm.  Small
nodules are present bilaterally.

Lymphadenopathy:  None visualized.
IMPRESSION: Multiple bilateral thyroid nodules both solid and cystic as noted
above consistent with multinodular goiter.

## 2014-05-14 ENCOUNTER — Other Ambulatory Visit: Payer: Self-pay | Admitting: Family Medicine

## 2014-05-14 DIAGNOSIS — Z1231 Encounter for screening mammogram for malignant neoplasm of breast: Secondary | ICD-10-CM

## 2014-05-20 ENCOUNTER — Ambulatory Visit
Admission: RE | Admit: 2014-05-20 | Discharge: 2014-05-20 | Disposition: A | Discharge status: Home / Self Care | Payer: Federal, State, Local not specified - PPO | Source: Ambulatory Visit | Attending: Family Medicine | Admitting: Family Medicine

## 2014-05-20 DIAGNOSIS — Z1231 Encounter for screening mammogram for malignant neoplasm of breast: Secondary | ICD-10-CM

## 2014-05-20 IMAGING — MG MM DIGITAL SCREENING BILAT
5 series · 5 of 5 positions shown · non-contrast
Comparison: Previous exam(s).

CLINICAL DATA: Screening.

EXAM:
DIGITAL SCREENING BILATERAL MAMMOGRAM WITH CAD

[L MLO (1 of 2)]
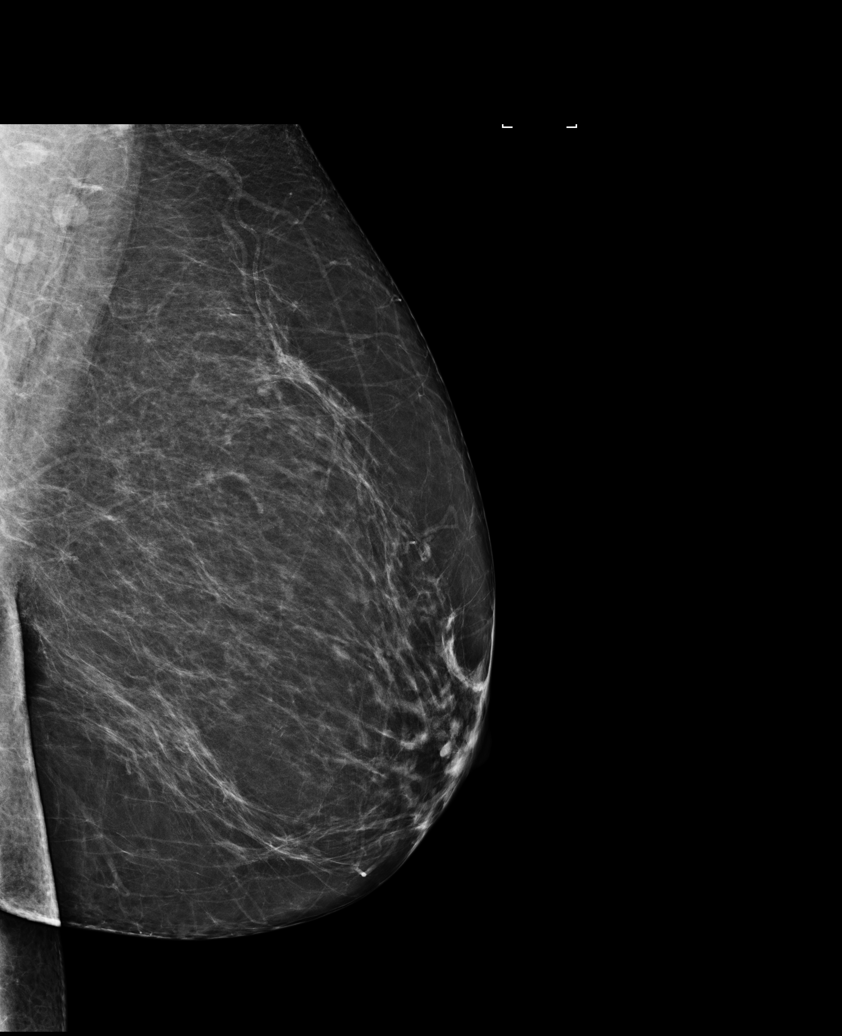

[L CC]
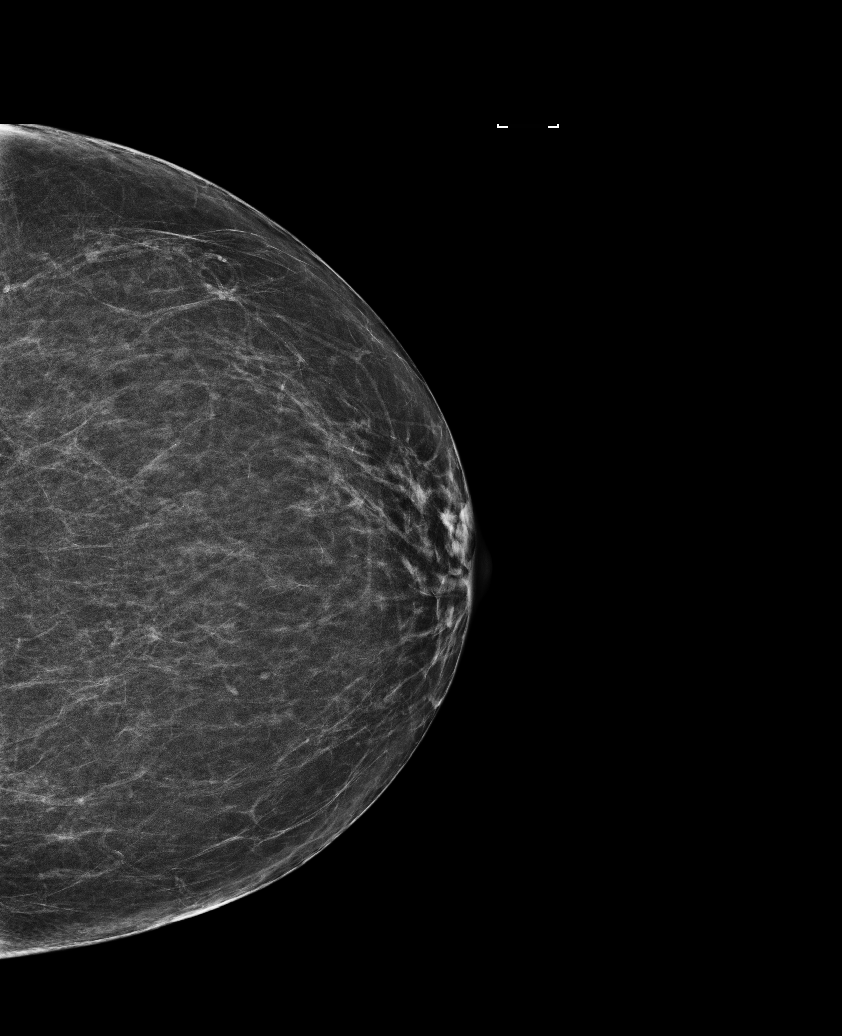

[L MLO (2 of 2)]
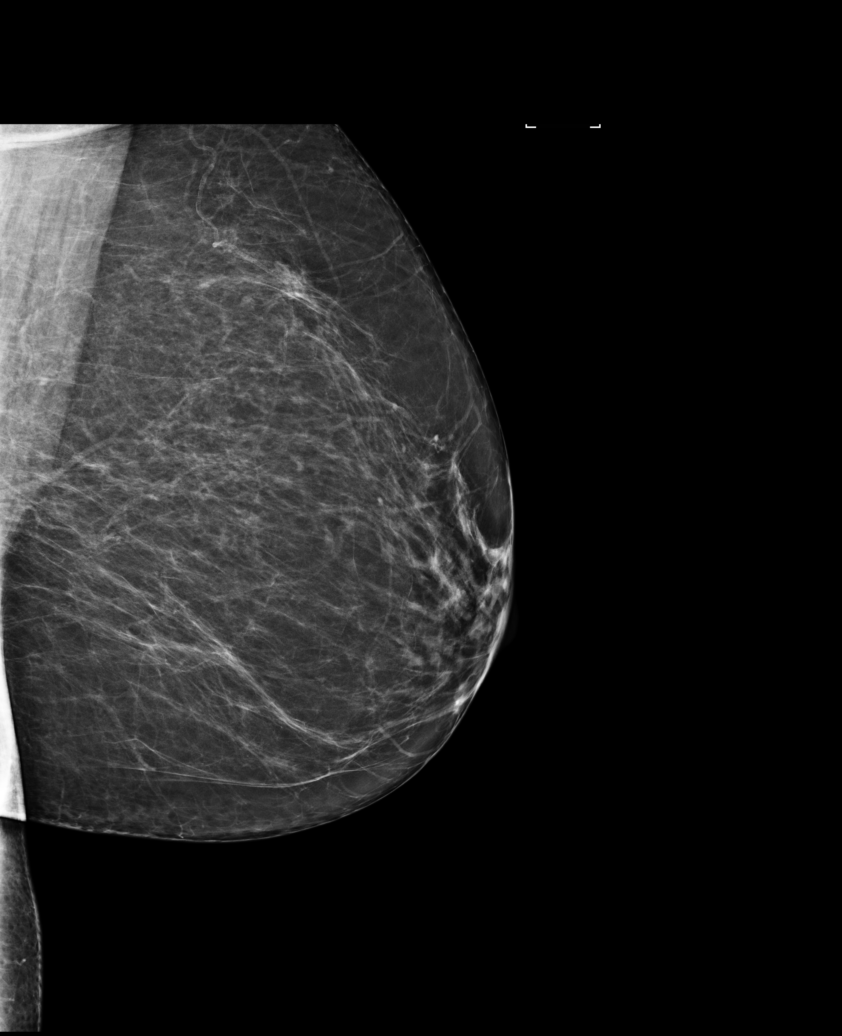

[R MLO]
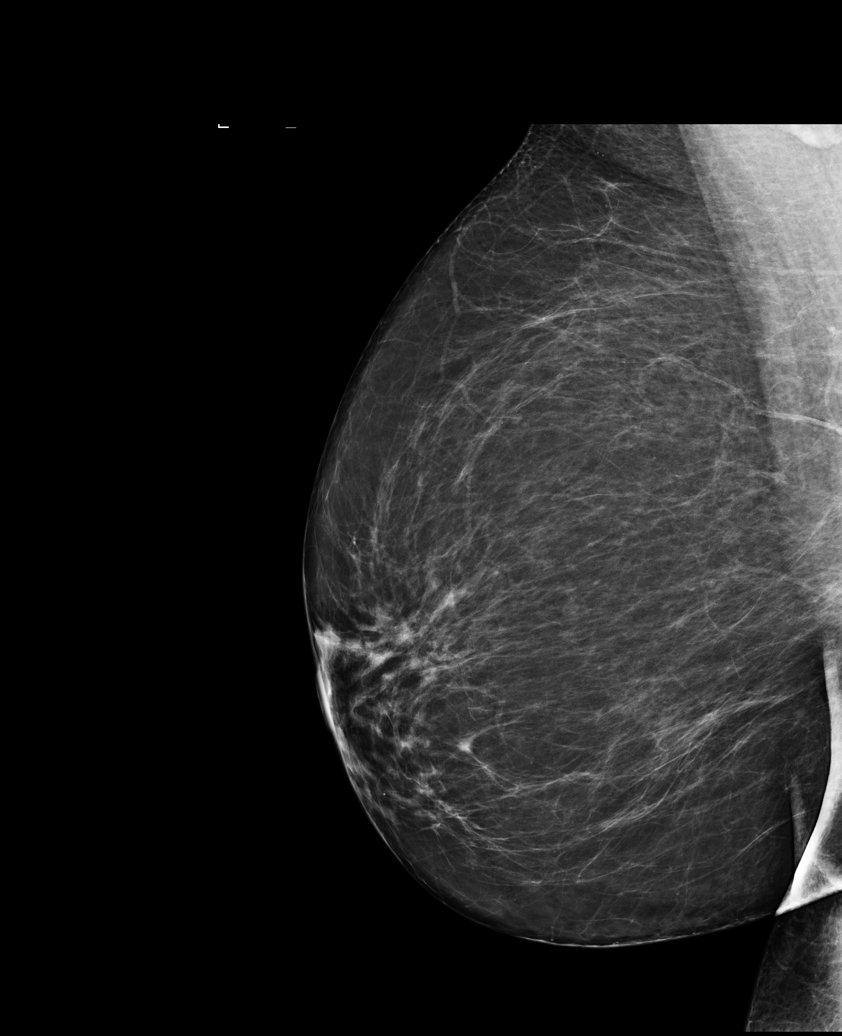

[R CC]
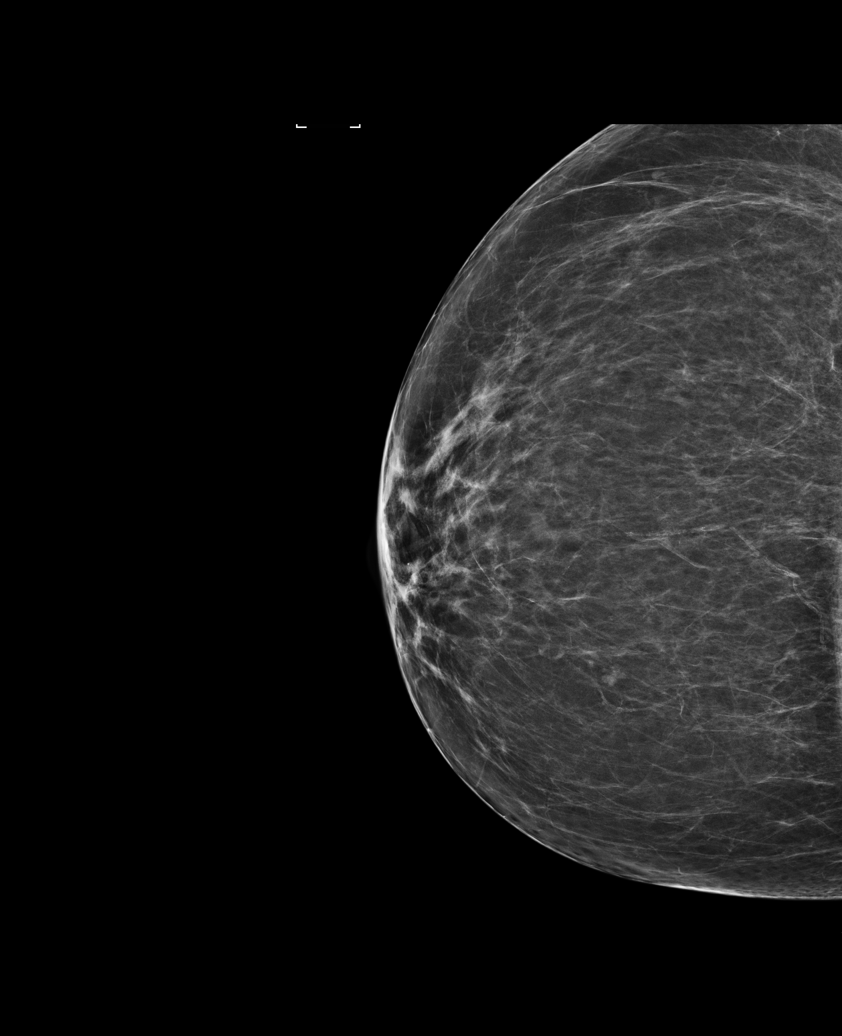

[5 of 5 positions shown; findings below may reference images not displayed]

ACR Breast Density Category b: There are scattered areas of
fibroglandular density.
FINDINGS: There are no findings suspicious for malignancy. Images were
processed with CAD.
IMPRESSION: No mammographic evidence of malignancy. A result letter of this
screening mammogram will be mailed directly to the patient.

RECOMMENDATION:
Screening mammogram in one year. (Code:[US])

BI-RADS CATEGORY  1: Negative.

## 2015-04-12 ENCOUNTER — Emergency Department (HOSPITAL_BASED_OUTPATIENT_CLINIC_OR_DEPARTMENT_OTHER)
Admission: EM | Admit: 2015-04-12 | Discharge: 2015-04-12 | Disposition: A | Discharge status: Home / Self Care | Payer: Federal, State, Local not specified - PPO | Attending: Emergency Medicine | Admitting: Emergency Medicine

## 2015-04-12 ENCOUNTER — Encounter (HOSPITAL_BASED_OUTPATIENT_CLINIC_OR_DEPARTMENT_OTHER): Payer: Self-pay | Admitting: *Deleted

## 2015-04-12 DIAGNOSIS — Z79899 Other long term (current) drug therapy: Secondary | ICD-10-CM | POA: Insufficient documentation

## 2015-04-12 DIAGNOSIS — M659 Synovitis and tenosynovitis, unspecified: Secondary | ICD-10-CM | POA: Insufficient documentation

## 2015-04-12 DIAGNOSIS — M79642 Pain in left hand: Secondary | ICD-10-CM | POA: Diagnosis present

## 2015-04-12 LAB — CBC WITH DIFFERENTIAL/PLATELET
Basophils Absolute: 0 10*3/uL (ref 0.0–0.1)
Basophils Relative: 0 %
Eosinophils Absolute: 0.1 10*3/uL (ref 0.0–0.7)
Eosinophils Relative: 1 %
HCT: 34.2 % — ABNORMAL LOW (ref 36.0–46.0)
Hemoglobin: 10.8 g/dL — ABNORMAL LOW (ref 12.0–15.0)
Lymphocytes Relative: 44 %
Lymphs Abs: 4.3 10*3/uL — ABNORMAL HIGH (ref 0.7–4.0)
MCH: 19.4 pg — ABNORMAL LOW (ref 26.0–34.0)
MCHC: 31.6 g/dL (ref 30.0–36.0)
MCV: 61.5 fL — ABNORMAL LOW (ref 78.0–100.0)
Monocytes Absolute: 0.7 10*3/uL (ref 0.1–1.0)
Monocytes Relative: 7 %
Neutro Abs: 4.6 10*3/uL (ref 1.7–7.7)
Neutrophils Relative %: 48 %
Platelets: 492 10*3/uL — ABNORMAL HIGH (ref 150–400)
RBC: 5.56 MIL/uL — ABNORMAL HIGH (ref 3.87–5.11)
RDW: 18.9 % — ABNORMAL HIGH (ref 11.5–15.5)
WBC: 9.7 10*3/uL (ref 4.0–10.5)

## 2015-04-12 LAB — BASIC METABOLIC PANEL
Anion gap: 8 (ref 5–15)
BUN: 16 mg/dL (ref 6–20)
CO2: 26 mmol/L (ref 22–32)
Calcium: 9.3 mg/dL (ref 8.9–10.3)
Chloride: 105 mmol/L (ref 101–111)
Creatinine, Ser: 0.81 mg/dL (ref 0.44–1.00)
GFR calc Af Amer: >60 mL/min (ref >60)
GFR calc non Af Amer: >60 mL/min (ref >60)
Glucose, Bld: 156 mg/dL — ABNORMAL HIGH (ref 65–99)
Potassium: 3.6 mmol/L (ref 3.5–5.1)
Sodium: 139 mmol/L (ref 135–145)

## 2015-04-12 MED ORDER — HYDROCODONE-ACETAMINOPHEN 5-325 MG PO TABS: 1.0000 | ORAL_TABLET | Freq: Four times a day (QID) | ORAL | Status: DC | PRN

## 2015-04-12 MED ORDER — PREDNISONE 50 MG PO TABS: 50.0000 mg | ORAL_TABLET | Freq: Every day | ORAL | Status: DC

## 2015-04-12 MED ORDER — HYDROCODONE-ACETAMINOPHEN 5-325 MG PO TABS
1.0000 | ORAL_TABLET | Freq: Once | ORAL | Status: DC
  Filled 2015-04-12: qty 1

## 2015-04-12 MED ORDER — IBUPROFEN 800 MG PO TABS
800.0000 mg | ORAL_TABLET | Freq: Once | ORAL | Status: AC
  Administered 2015-04-12: 800 mg via ORAL
  Filled 2015-04-12: qty 1

## 2015-04-12 MED ORDER — CEPHALEXIN 500 MG PO CAPS: 1000.0000 mg | ORAL_CAPSULE | Freq: Two times a day (BID) | ORAL | Status: DC

## 2015-04-12 NOTE — ED Notes (Signed)
Provider at bedside

## 2015-04-12 NOTE — ED Provider Notes (Signed)
CSN: [id number]     Arrival date & time 04/12/15  1726 History   First MD Initiated Contact with Patient 04/12/15 1734     Chief Complaint  Patient presents with  . Hand Pain     (Consider location/radiation/quality/duration/timing/severity/associated sxs/prior Treatment) HPI Patient presents to the emergency department with left hand swelling.  It started on Sunday.  The patient saw her primary care doctor yesterday who did blood testing and x-rays and found that there is no significant abnormalities.  Patient states she does not have any injury and no trauma noted to the area.  Patient states she does not have any fever, nausea, vomiting, weakness, dizziness, numbness, chest pain, neck pain, shortness of breath or syncope.  The patient states that she took meloxicam that was prescribed for her doctor without relief of her symptoms History reviewed. No pertinent past medical history. History reviewed. No pertinent past surgical history. No family history on file. Social History  Substance Use Topics  . Smoking status: Never Smoker   . Smokeless tobacco: None  . Alcohol Use: No   OB History    No data available     Review of Systems All other systems negative except as documented in the HPI. All pertinent positives and negatives as reviewed in the HPI.   Allergies  Review of patient's allergies indicates no known allergies.  Home Medications   Prior to Admission medications   Medication Sig Start Date End Date Taking? Authorizing Provider  ferrous sulfate 325 (65 FE) MG tablet Take 325 mg by mouth daily with breakfast.   Yes Historical Provider, MD   BP 180/90 mmHg  Pulse 108  Temp(Src) 98.4 F (36.9 C) (Oral)  Resp 18  Ht  (1.702 m)  Wt 92.987 kg  BMI 32.10 kg/m2  SpO2 100% Physical Exam  Constitutional: She appears well-developed and well-nourished. No distress.  Cardiovascular: Normal rate and regular rhythm.   Pulmonary/Chest: Effort normal and breath sounds  normal.  Musculoskeletal:       Hands:   ED Course  Procedures (including critical care time) Labs Review Labs Reviewed  CBC WITH DIFFERENTIAL/PLATELET - Abnormal; Notable for the following:    RBC 5.56 (*)    Hemoglobin 10.8 (*)    HCT 34.2 (*)    MCV 61.5 (*)    MCH 19.4 (*)    RDW 18.9 (*)    Platelets 492 (*)    Lymphs Abs 4.3 (*)    All other components within normal limits  BASIC METABOLIC PANEL - Abnormal; Notable for the following:    Glucose, Bld 156 (*)    All other components within normal limits    Imaging Review No results found. I have personally reviewed and evaluated these images and lab results as part of my medical decision-making.  I spoke with the hand surgeon on call, Dr. Amanda Pea who will see the patient tomorrow in his office at 2 PM he also advised to start her on steroids and cephalexin along with placing her in a finger splint.  Patient is advised plan and all questions were answered.  This is most likely an inflammatory tenosynovitis rather than an infectious source, but we will treat for both   Charlestine Night, PA-C 04/12/15 1953  Glynn Octave, MD 04/12/15 2352

## 2015-04-12 NOTE — ED Notes (Signed)
She was seen by her MD yesterday for left hand pain and swelling. She had an xray and was started on Meloxicam. No improvement today.

## 2015-04-12 NOTE — Discharge Instructions (Signed)
Follow-up tomorrow at 2 PM with Dr. Amanda Pea.  Return here as needed

## 2015-04-12 NOTE — ED Notes (Signed)
PA at bedside.

## 2015-04-12 NOTE — ED Notes (Signed)
Lawyer, PA at bedside.  

## 2016-01-20 DIAGNOSIS — K080 Exfoliation of teeth due to systemic causes: Secondary | ICD-10-CM | POA: Diagnosis not present

## 2016-04-30 DIAGNOSIS — Z23 Encounter for immunization: Secondary | ICD-10-CM | POA: Diagnosis not present

## 2016-05-14 DIAGNOSIS — Z130 Encounter for screening for diseases of the blood and blood-forming organs and certain disorders involving the immune mechanism: Secondary | ICD-10-CM | POA: Diagnosis not present

## 2016-05-14 DIAGNOSIS — Z13220 Encounter for screening for lipoid disorders: Secondary | ICD-10-CM | POA: Diagnosis not present

## 2016-05-14 DIAGNOSIS — Z1231 Encounter for screening mammogram for malignant neoplasm of breast: Secondary | ICD-10-CM | POA: Diagnosis not present

## 2016-05-14 DIAGNOSIS — E0590 Thyrotoxicosis, unspecified without thyrotoxic crisis or storm: Secondary | ICD-10-CM | POA: Diagnosis not present

## 2016-05-14 DIAGNOSIS — R946 Abnormal results of thyroid function studies: Secondary | ICD-10-CM | POA: Diagnosis not present

## 2016-05-14 DIAGNOSIS — Z0000 Encounter for general adult medical examination without abnormal findings: Secondary | ICD-10-CM | POA: Diagnosis not present

## 2016-05-17 ENCOUNTER — Other Ambulatory Visit: Payer: Self-pay | Admitting: Family Medicine

## 2016-05-17 DIAGNOSIS — Z1231 Encounter for screening mammogram for malignant neoplasm of breast: Secondary | ICD-10-CM

## 2016-08-30 DIAGNOSIS — K080 Exfoliation of teeth due to systemic causes: Secondary | ICD-10-CM | POA: Diagnosis not present

## 2016-11-30 DIAGNOSIS — Z23 Encounter for immunization: Secondary | ICD-10-CM | POA: Diagnosis not present

## 2017-02-28 DIAGNOSIS — K080 Exfoliation of teeth due to systemic causes: Secondary | ICD-10-CM | POA: Diagnosis not present

## 2017-05-20 DIAGNOSIS — Z124 Encounter for screening for malignant neoplasm of cervix: Secondary | ICD-10-CM | POA: Diagnosis not present

## 2017-05-20 DIAGNOSIS — Z0000 Encounter for general adult medical examination without abnormal findings: Secondary | ICD-10-CM | POA: Diagnosis not present

## 2017-05-20 DIAGNOSIS — E0590 Thyrotoxicosis, unspecified without thyrotoxic crisis or storm: Secondary | ICD-10-CM | POA: Diagnosis not present

## 2017-05-20 DIAGNOSIS — Z13220 Encounter for screening for lipoid disorders: Secondary | ICD-10-CM | POA: Diagnosis not present

## 2017-09-27 DIAGNOSIS — S80861A Insect bite (nonvenomous), right lower leg, initial encounter: Secondary | ICD-10-CM | POA: Diagnosis not present

## 2017-10-30 DIAGNOSIS — K080 Exfoliation of teeth due to systemic causes: Secondary | ICD-10-CM | POA: Diagnosis not present

## 2018-01-28 DIAGNOSIS — K080 Exfoliation of teeth due to systemic causes: Secondary | ICD-10-CM | POA: Diagnosis not present

## 2018-01-29 DIAGNOSIS — K047 Periapical abscess without sinus: Secondary | ICD-10-CM | POA: Diagnosis not present

## 2018-02-03 DIAGNOSIS — Z23 Encounter for immunization: Secondary | ICD-10-CM | POA: Diagnosis not present

## 2018-02-28 DIAGNOSIS — K080 Exfoliation of teeth due to systemic causes: Secondary | ICD-10-CM | POA: Diagnosis not present

## 2018-04-09 DIAGNOSIS — K080 Exfoliation of teeth due to systemic causes: Secondary | ICD-10-CM | POA: Diagnosis not present

## 2018-04-17 DIAGNOSIS — H18603 Keratoconus, unspecified, bilateral: Secondary | ICD-10-CM | POA: Diagnosis not present

## 2019-01-05 DIAGNOSIS — Z23 Encounter for immunization: Secondary | ICD-10-CM | POA: Diagnosis not present

## 2019-03-09 ENCOUNTER — Other Ambulatory Visit: Payer: Self-pay | Admitting: Family Medicine

## 2019-03-09 DIAGNOSIS — Z1231 Encounter for screening mammogram for malignant neoplasm of breast: Secondary | ICD-10-CM

## 2019-08-07 DIAGNOSIS — M778 Other enthesopathies, not elsewhere classified: Secondary | ICD-10-CM | POA: Diagnosis not present

## 2019-08-07 DIAGNOSIS — M79641 Pain in right hand: Secondary | ICD-10-CM | POA: Diagnosis not present

## 2020-01-25 ENCOUNTER — Encounter (HOSPITAL_BASED_OUTPATIENT_CLINIC_OR_DEPARTMENT_OTHER): Payer: Self-pay | Admitting: *Deleted

## 2020-01-25 ENCOUNTER — Inpatient Hospital Stay (HOSPITAL_BASED_OUTPATIENT_CLINIC_OR_DEPARTMENT_OTHER)
Admission: EM | Admit: 2020-01-25 | Discharge: 2020-01-27 | DRG: 074 | Disposition: A | Discharge status: Home / Self Care | Payer: Federal, State, Local not specified - PPO | Attending: Internal Medicine | Admitting: Internal Medicine

## 2020-01-25 ENCOUNTER — Emergency Department (HOSPITAL_BASED_OUTPATIENT_CLINIC_OR_DEPARTMENT_OTHER): Payer: Federal, State, Local not specified - PPO

## 2020-01-25 ENCOUNTER — Other Ambulatory Visit: Payer: Self-pay

## 2020-01-25 DIAGNOSIS — E669 Obesity, unspecified: Secondary | ICD-10-CM | POA: Diagnosis present

## 2020-01-25 DIAGNOSIS — N179 Acute kidney failure, unspecified: Secondary | ICD-10-CM | POA: Diagnosis not present

## 2020-01-25 DIAGNOSIS — R519 Headache, unspecified: Secondary | ICD-10-CM | POA: Diagnosis not present

## 2020-01-25 DIAGNOSIS — R200 Anesthesia of skin: Secondary | ICD-10-CM | POA: Diagnosis present

## 2020-01-25 DIAGNOSIS — G5603 Carpal tunnel syndrome, bilateral upper limbs: Secondary | ICD-10-CM | POA: Diagnosis not present

## 2020-01-25 DIAGNOSIS — M47812 Spondylosis without myelopathy or radiculopathy, cervical region: Secondary | ICD-10-CM | POA: Diagnosis not present

## 2020-01-25 DIAGNOSIS — G43109 Migraine with aura, not intractable, without status migrainosus: Secondary | ICD-10-CM | POA: Diagnosis not present

## 2020-01-25 DIAGNOSIS — Z20822 Contact with and (suspected) exposure to covid-19: Secondary | ICD-10-CM | POA: Diagnosis not present

## 2020-01-25 DIAGNOSIS — R202 Paresthesia of skin: Secondary | ICD-10-CM | POA: Diagnosis not present

## 2020-01-25 DIAGNOSIS — G459 Transient cerebral ischemic attack, unspecified: Secondary | ICD-10-CM | POA: Diagnosis present

## 2020-01-25 DIAGNOSIS — D509 Iron deficiency anemia, unspecified: Secondary | ICD-10-CM | POA: Diagnosis not present

## 2020-01-25 DIAGNOSIS — M5417 Radiculopathy, lumbosacral region: Secondary | ICD-10-CM | POA: Diagnosis not present

## 2020-01-25 DIAGNOSIS — Z79899 Other long term (current) drug therapy: Secondary | ICD-10-CM | POA: Diagnosis not present

## 2020-01-25 DIAGNOSIS — Z6833 Body mass index (BMI) 33.0-33.9, adult: Secondary | ICD-10-CM

## 2020-01-25 DIAGNOSIS — D649 Anemia, unspecified: Secondary | ICD-10-CM | POA: Diagnosis not present

## 2020-01-25 DIAGNOSIS — E860 Dehydration: Secondary | ICD-10-CM | POA: Diagnosis not present

## 2020-01-25 DIAGNOSIS — R252 Cramp and spasm: Secondary | ICD-10-CM | POA: Diagnosis present

## 2020-01-25 DIAGNOSIS — Z7952 Long term (current) use of systemic steroids: Secondary | ICD-10-CM | POA: Diagnosis not present

## 2020-01-25 IMAGING — CT CT HEAD W/O CM
3 of 4 series · 15 of 47 positions shown, 18 images · non-contrast
Comparison: None.

CLINICAL DATA: Blurred vision, left leg tingling and pain, headache

EXAM:
CT HEAD WITHOUT CONTRAST
TECHNIQUE: Contiguous axial images were obtained from the base of the skull
through the vertex without intravenous contrast.

[Series 2: head wo · axial · 0.45mm/px · z∈[+1312,+1442]mm · 9 of 32 slices shown, 12 images]
[im 3/32  brain]
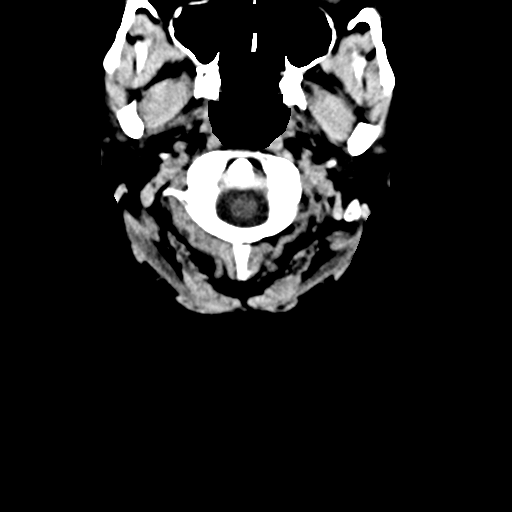
[im 3/32  bone]
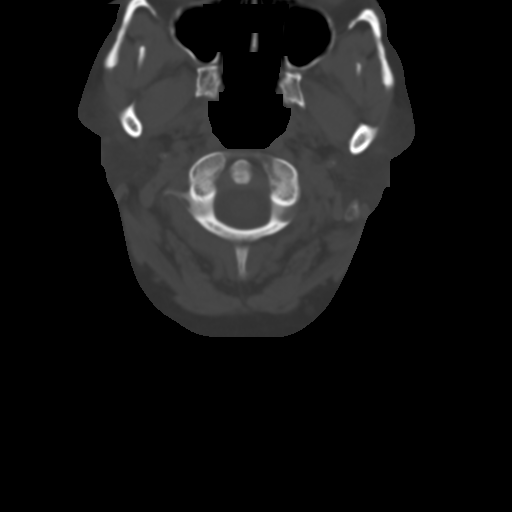
[im 7/32  brain]
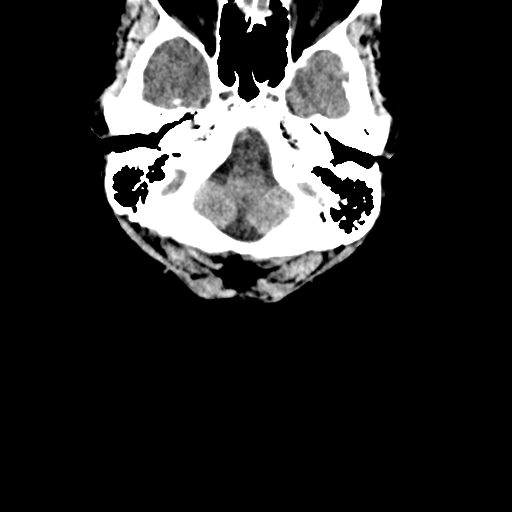
[im 9/32  brain]
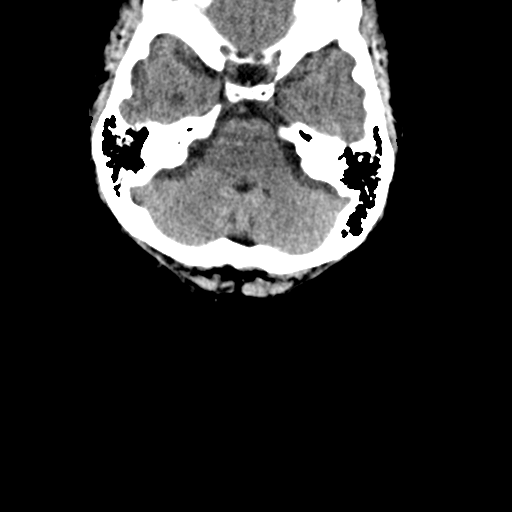
[im 14/32  brain]
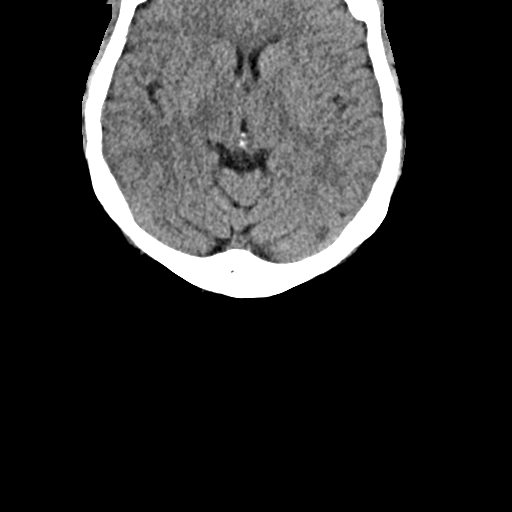
[im 16/32  brain]
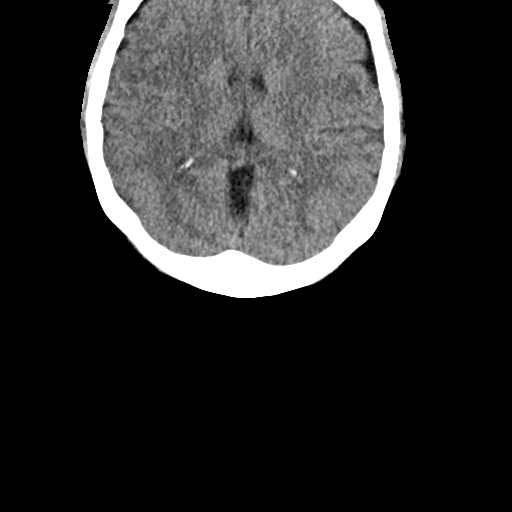
[im 16/32  bone]
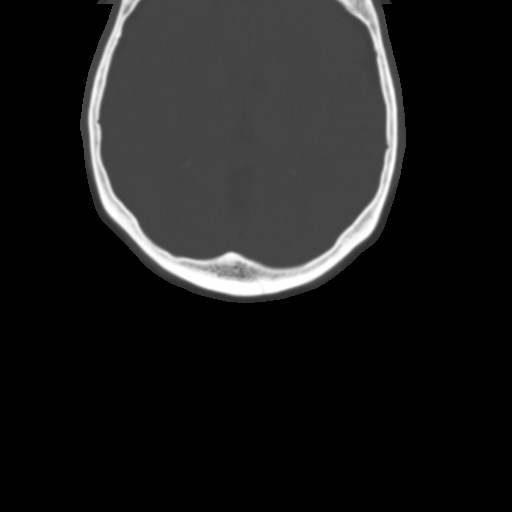
[im 18/32  brain]
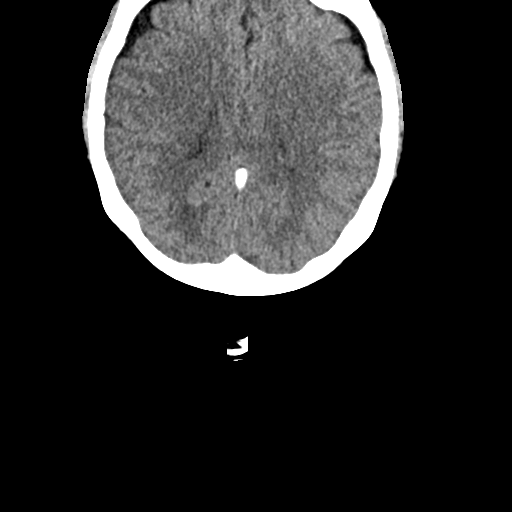
[im 23/32  brain]
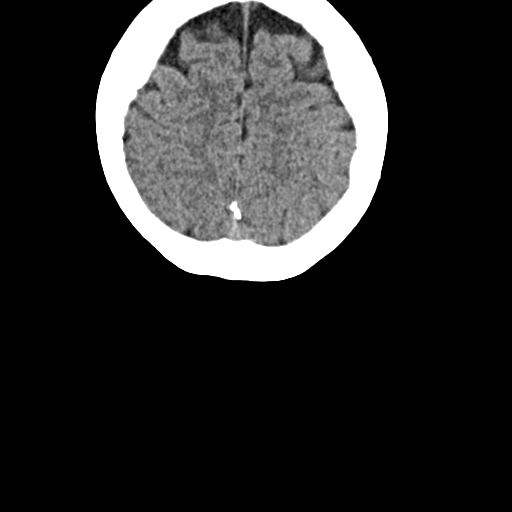
[im 25/32  brain]
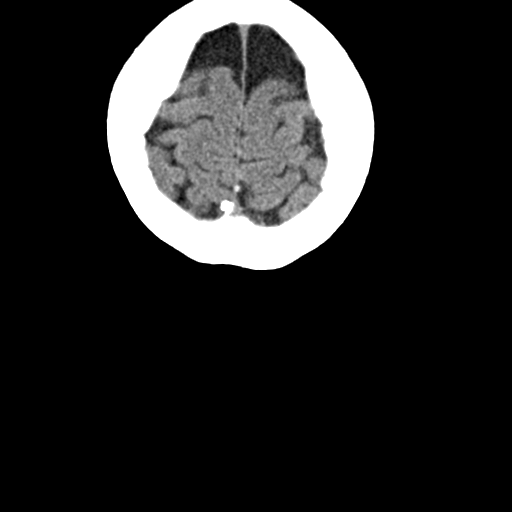
[im 29/32  brain]
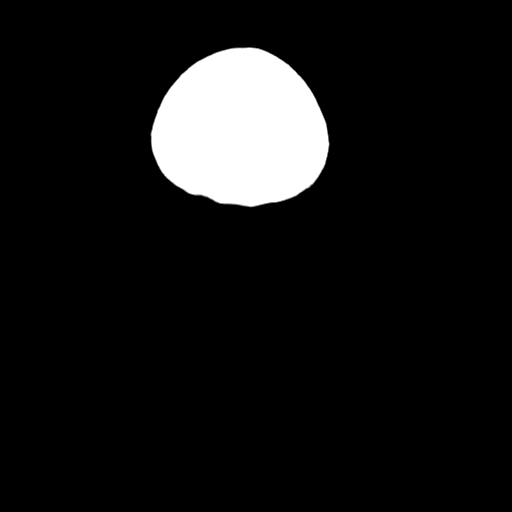
[im 29/32  bone]
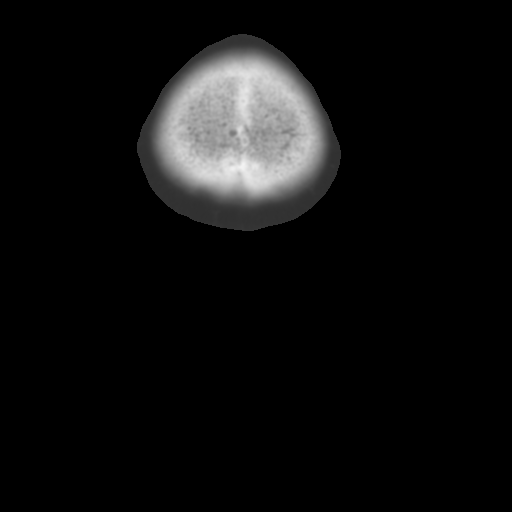

[Series 5: coronal soft · coronal · 0.33mm/px · 3 of 67 slices shown]
[im 23/67  brain]
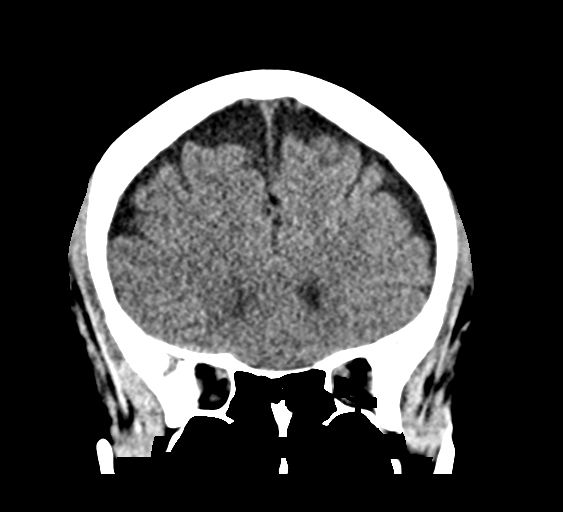
[im 30/67  brain]
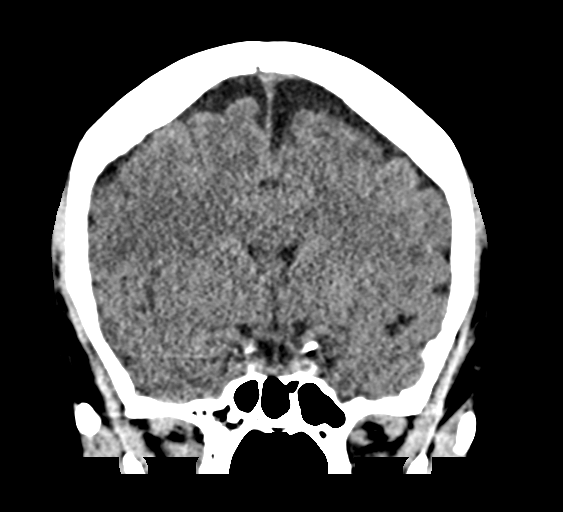
[im 37/67  brain]
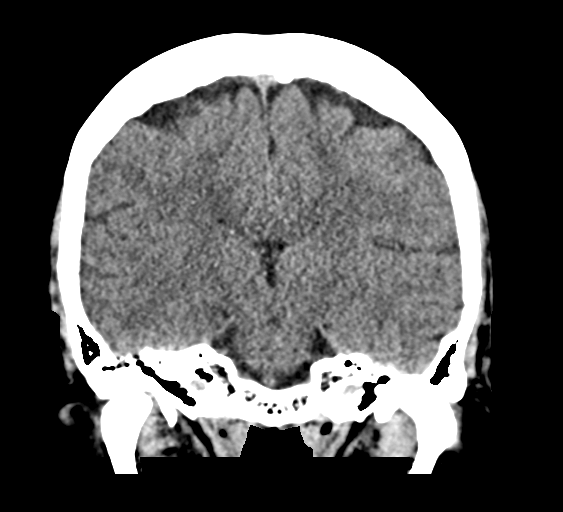

[Series 6: sag soft · sagittal · 0.31mm/px · 3 of 62 slices shown]
[im 21/62  brain]
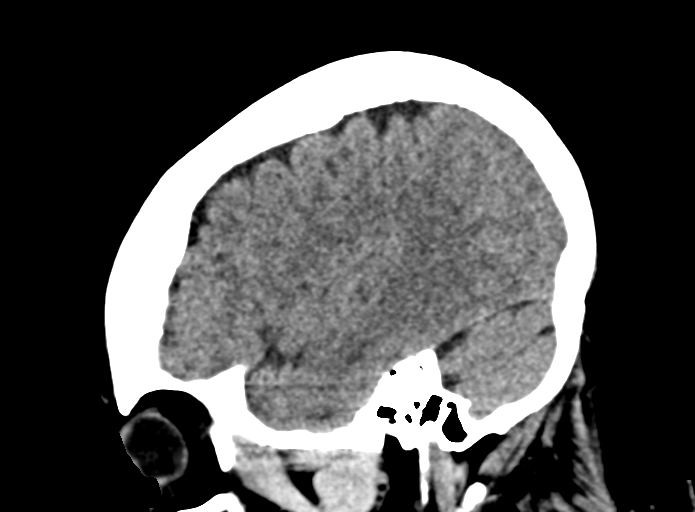
[im 31/62  brain]
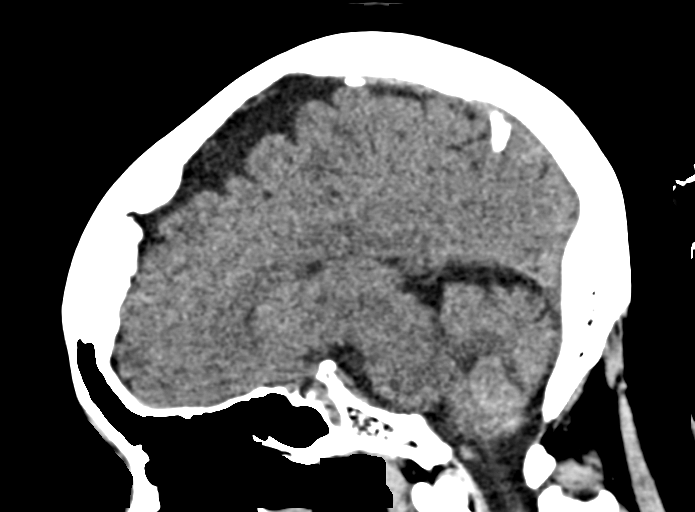
[im 41/62  brain]
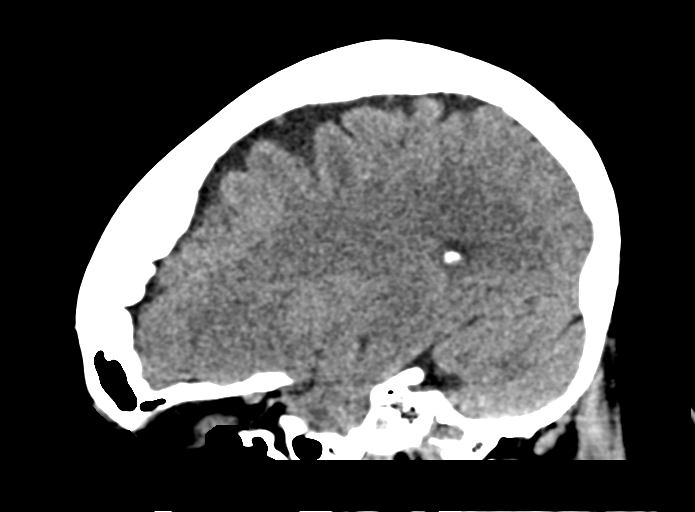

[15 of 47 positions shown; findings below may reference images not displayed]

FINDINGS: Brain: No acute infarct or hemorrhage. Lateral ventricles and
midline structures are unremarkable. No acute extra-axial fluid
collections. No mass effect.

Vascular: No hyperdense vessel or unexpected calcification.

Skull: Normal. Negative for fracture or focal lesion.

Sinuses/Orbits: No acute finding.

Other: None.
IMPRESSION: 1. No acute intracranial process.

## 2020-01-25 NOTE — ED Notes (Signed)
Pt. Reports feeling a tingling sensation in the L arm and L hand.

## 2020-01-25 NOTE — ED Triage Notes (Signed)
Tingling in her left hand, headache, blurred vision, back pain with tingling in her left leg x 8 hours ago. She is ambulatory.

## 2020-01-25 NOTE — ED Provider Notes (Signed)
[facility] Provider Note: Lowella Dell, MD, FACEP  CSN: [id number] MRN: [id number] ARRIVAL: 01/25/20 at 2224 ROOM: [facility]   CHIEF COMPLAINT  Numbness   HISTORY OF PRESENT ILLNESS  01/25/20 10:55 PM Brenda Brock is a 64 y.o. female developed paresthesias (tingling but not complete lack of sensitivity) in her left hand about 3 PM this subsequently progressed to involve most of her left arm.  She thought this might be due to a tight fitting bra strap and came home from work.  Removing her bra did not relieve the symptoms.  About 6 PM she developed a mild presents this 2 out of 10) nonlocalized headache as well as some left lower back pain.  She was looking at a pill could not shirt and had a sensation that there were more polka dots there than usual.  She has never had that experience looking at that shirt before and that symptom is no longer present.  She has had some transient paresthesias of the left leg but none now.  She has no paresthesias of the face or trunk.  She has noted no weakness.  Nothing seems to make the symptoms better or worse.  She is able to ambulate without difficulty she has had some blurred vision.   History reviewed. No pertinent past medical history.  History reviewed. No pertinent surgical history.  No family history on file.  Social History   Tobacco Use  . Smoking status: Never Smoker  . Smokeless tobacco: Never Used  Substance Use Topics  . Alcohol use: No  . Drug use: No    Prior to Admission medications   Medication Sig Start Date End Date Taking? Authorizing Provider  ferrous sulfate 325 (65 FE) MG tablet Take 325 mg by mouth daily with breakfast.   Yes [provider]  cephALEXin (KEFLEX) 500 MG capsule Take 2 capsules (1,000 mg total) by mouth 2 (two) times daily. 04/12/15   Lawyer, Cristal Deer, PA-C  HYDROcodone-acetaminophen (NORCO/VICODIN) 5-325 MG tablet Take 1 tablet by mouth every 6 (six) hours as needed for  moderate pain. 04/12/15   Lawyer, Cristal Deer, PA-C  predniSONE (DELTASONE) 50 MG tablet Take 1 tablet (50 mg total) by mouth daily. 04/12/15   Lawyer, Cristal Deer, PA-C    Allergies Patient has no known allergies.   REVIEW OF SYSTEMS  Negative except as noted here or in the History of Present Illness.   PHYSICAL EXAMINATION  Initial Vital Signs Blood pressure (!) 158/85, pulse (!) 102, temperature 98.6 F (37 C), temperature source Oral, resp. rate 14, height 5\' 7"  (1.702 m), weight 96 kg, SpO2 97 %.  Examination General: Well-developed, well-nourished female in no acute distress; appearance consistent with age of record HENT: normocephalic; atraumatic Eyes: pupils equal, round and reactive to light; extraocular muscles intact; arcus senilis bilaterally Neck: supple; no bruit Heart: regular rate and rhythm; no murmur Lungs: clear to auscultation bilaterally Abdomen: soft; nondistended; nontender; bowel sounds present Back: Mild left SI tenderness Extremities: No deformity; full range of motion; pulses normal Neurologic: Awake, alert and oriented; motor function intact in all extremities and symmetric; no facial droop; sensation intact and symmetric with the exception of altered sensation in the left upper extremity; no pronator drift; normal finger-to-nose; normal coordination and speech Skin: Warm and dry Psychiatric: Normal mood and affect   RESULTS  Summary of this visit's results, reviewed and interpreted by myself:   EKG Interpretation  Date/Time:  Monday January 25 2020 23:08:52 EDT Ventricular Rate:  96 PR  Interval:    QRS Duration: 78 QT Interval:  367 QTC Calculation: 464 R Axis:   65 Text Interpretation: Sinus rhythm Normal ECG No previous ECGs available Confirmed by Simrit Gohlke, Jonny Ruiz ([id number]) on 01/25/2020 11:13:18 PM      Laboratory Studies: Results for orders placed or performed during the hospital encounter of 01/25/20 (from the past 24 hour(s))  CBC with  Differential     Status: Abnormal   Collection Time: 01/26/20 12:14 AM  Result Value Ref Range   WBC 8.2 4.0 - 10.5 K/uL   RBC 4.82 3.87 - 5.11 MIL/uL   Hemoglobin 9.6 (L) 12.0 - 15.0 g/dL   HCT 93.7 (L) 36 - 46 %   MCV 65.1 (L) 80.0 - 100.0 fL   MCH 19.9 (L) 26.0 - 34.0 pg   MCHC 30.6 30.0 - 36.0 g/dL   RDW 90.2 (H) 40.9 - 73.5 %   Platelets 391 150 - 400 K/uL   nRBC 0.0 0.0 - 0.2 %   Neutrophils Relative % 38 %   Neutro Abs 3.1 1.7 - 7.7 K/uL   Lymphocytes Relative 52 %   Lymphs Abs 4.3 (H) 0.7 - 4.0 K/uL   Monocytes Relative 7 %   Monocytes Absolute 0.6 0.1 - 1.0 K/uL   Eosinophils Relative 2 %   Eosinophils Absolute 0.1 0.0 - 0.5 K/uL   Basophils Relative 1 %   Basophils Absolute 0.0 0.0 - 0.1 K/uL   Immature Granulocytes 0 %   Abs Immature Granulocytes 0.02 0.00 - 0.07 K/uL  Basic metabolic panel     Status: Abnormal   Collection Time: 01/26/20 12:14 AM  Result Value Ref Range   Sodium 141 135 - 145 mmol/L   Potassium 4.1 3.5 - 5.1 mmol/L   Chloride 106 98 - 111 mmol/L   CO2 26 22 - 32 mmol/L   Glucose, Bld 111 (H) 70 - 99 mg/dL   BUN 16 8 - 23 mg/dL   Creatinine, Ser 3.29 (H) 0.44 - 1.00 mg/dL   Calcium 9.0 8.9 - 92.4 mg/dL   GFR, Estimated 54 (L) >60 mL/min   Anion gap 9 5 - 15  Respiratory Panel by RT PCR (Flu A&B, Covid) - Nasopharyngeal Swab     Status: None   Collection Time: 01/26/20  2:52 AM   Specimen: Nasopharyngeal Swab  Result Value Ref Range   SARS Coronavirus 2 by RT PCR NEGATIVE NEGATIVE   Influenza A by PCR NEGATIVE NEGATIVE   Influenza B by PCR NEGATIVE NEGATIVE  Urinalysis, Routine w reflex microscopic Urine, Clean Catch     Status: Abnormal   Collection Time: 01/26/20  2:59 AM  Result Value Ref Range   Color, Urine YELLOW YELLOW   APPearance CLEAR CLEAR   Specific Gravity, Urine 1.025 1.005 - 1.030   pH 6.0 5.0 - 8.0   Glucose, UA NEGATIVE NEGATIVE mg/dL   Hgb urine dipstick MODERATE (A) NEGATIVE   Bilirubin Urine NEGATIVE NEGATIVE    Ketones, ur NEGATIVE NEGATIVE mg/dL   Protein, ur NEGATIVE NEGATIVE mg/dL   Nitrite NEGATIVE NEGATIVE   Leukocytes,Ua NEGATIVE NEGATIVE  Urinalysis, Microscopic (reflex)     Status: Abnormal   Collection Time: 01/26/20  2:59 AM  Result Value Ref Range   RBC / HPF 6-10 0 - 5 RBC/hpf   WBC, UA 0-5 0 - 5 WBC/hpf   Bacteria, UA FEW (A) NONE SEEN   Squamous Epithelial / LPF 0-5 0 - 5   Imaging Studies: CT Head Wo Contrast  Result Date: 01/25/2020 CLINICAL DATA:  Blurred vision, left leg tingling and pain, headache EXAM: CT HEAD WITHOUT CONTRAST TECHNIQUE: Contiguous axial images were obtained from the base of the skull through the vertex without intravenous contrast. COMPARISON:  None. FINDINGS: Brain: No acute infarct or hemorrhage. Lateral ventricles and midline structures are unremarkable. No acute extra-axial fluid collections. No mass effect. Vascular: No hyperdense vessel or unexpected calcification. Skull: Normal. Negative for fracture or focal lesion. Sinuses/Orbits: No acute finding. Other: None. IMPRESSION: 1. No acute intracranial process. Electronically Signed   By: Sharlet Salina M.D.   On: 01/25/2020 23:24    ED COURSE and MDM  Nursing notes, initial and subsequent vitals signs, including pulse oximetry, reviewed and interpreted by myself.  Vitals:   01/26/20 0130 01/26/20 0200 01/26/20 0230 01/26/20 0400  BP: 126/66 126/65 126/79 128/73  Pulse: 70 73 71 72  Resp: 14 15 15 16   Temp:    98.6 F (37 C)  TempSrc:    Oral  SpO2: 99% 98% 98% 99%  Weight:      Height:       Medications - No data to display  2:32 AM Patient's left arm paresthesias still present but no progression.  Back pain no longer present.  Still awaiting a urine specimen.  3:58 AM Neuro exam unchanged.  Will have patient admitted for stroke work-up.  Patient aware of having anemia and is on a single iron tablet daily.  PROCEDURES  Procedures   ED DIAGNOSES     ICD-10-CM   1. Paresthesias  R20.2    2. Microcytic anemia  D50.9        Kordel Leavy, MD 01/26/20 [phone]

## 2020-01-26 ENCOUNTER — Inpatient Hospital Stay (HOSPITAL_COMMUNITY): Payer: Federal, State, Local not specified - PPO

## 2020-01-26 DIAGNOSIS — Z6833 Body mass index (BMI) 33.0-33.9, adult: Secondary | ICD-10-CM | POA: Diagnosis not present

## 2020-01-26 DIAGNOSIS — R200 Anesthesia of skin: Secondary | ICD-10-CM | POA: Diagnosis not present

## 2020-01-26 DIAGNOSIS — M47812 Spondylosis without myelopathy or radiculopathy, cervical region: Secondary | ICD-10-CM | POA: Diagnosis not present

## 2020-01-26 DIAGNOSIS — R202 Paresthesia of skin: Secondary | ICD-10-CM | POA: Diagnosis not present

## 2020-01-26 DIAGNOSIS — G5603 Carpal tunnel syndrome, bilateral upper limbs: Secondary | ICD-10-CM | POA: Diagnosis not present

## 2020-01-26 DIAGNOSIS — E860 Dehydration: Secondary | ICD-10-CM | POA: Diagnosis not present

## 2020-01-26 DIAGNOSIS — I700 Atherosclerosis of aorta: Secondary | ICD-10-CM | POA: Diagnosis not present

## 2020-01-26 DIAGNOSIS — Z79899 Other long term (current) drug therapy: Secondary | ICD-10-CM | POA: Diagnosis not present

## 2020-01-26 DIAGNOSIS — M5417 Radiculopathy, lumbosacral region: Secondary | ICD-10-CM | POA: Diagnosis not present

## 2020-01-26 DIAGNOSIS — G43109 Migraine with aura, not intractable, without status migrainosus: Secondary | ICD-10-CM | POA: Diagnosis not present

## 2020-01-26 DIAGNOSIS — N179 Acute kidney failure, unspecified: Secondary | ICD-10-CM | POA: Diagnosis not present

## 2020-01-26 DIAGNOSIS — G459 Transient cerebral ischemic attack, unspecified: Secondary | ICD-10-CM | POA: Diagnosis not present

## 2020-01-26 DIAGNOSIS — M50221 Other cervical disc displacement at C4-C5 level: Secondary | ICD-10-CM | POA: Diagnosis not present

## 2020-01-26 DIAGNOSIS — Z20822 Contact with and (suspected) exposure to covid-19: Secondary | ICD-10-CM | POA: Diagnosis not present

## 2020-01-26 DIAGNOSIS — Z7952 Long term (current) use of systemic steroids: Secondary | ICD-10-CM | POA: Diagnosis not present

## 2020-01-26 DIAGNOSIS — R252 Cramp and spasm: Secondary | ICD-10-CM | POA: Diagnosis not present

## 2020-01-26 DIAGNOSIS — E669 Obesity, unspecified: Secondary | ICD-10-CM | POA: Diagnosis not present

## 2020-01-26 DIAGNOSIS — D509 Iron deficiency anemia, unspecified: Secondary | ICD-10-CM | POA: Diagnosis not present

## 2020-01-26 DIAGNOSIS — J398 Other specified diseases of upper respiratory tract: Secondary | ICD-10-CM | POA: Diagnosis not present

## 2020-01-26 DIAGNOSIS — Z8669 Personal history of other diseases of the nervous system and sense organs: Secondary | ICD-10-CM | POA: Diagnosis not present

## 2020-01-26 LAB — CBC WITH DIFFERENTIAL/PLATELET
Abs Immature Granulocytes: 0.02 10*3/uL (ref 0.00–0.07)
Basophils Absolute: 0 10*3/uL (ref 0.0–0.1)
Basophils Relative: 1 %
Eosinophils Absolute: 0.1 10*3/uL (ref 0.0–0.5)
Eosinophils Relative: 2 %
HCT: 31.4 % — ABNORMAL LOW (ref 36.0–46.0)
Hemoglobin: 9.6 g/dL — ABNORMAL LOW (ref 12.0–15.0)
Immature Granulocytes: 0 %
Lymphocytes Relative: 52 %
Lymphs Abs: 4.3 10*3/uL — ABNORMAL HIGH (ref 0.7–4.0)
MCH: 19.9 pg — ABNORMAL LOW (ref 26.0–34.0)
MCHC: 30.6 g/dL (ref 30.0–36.0)
MCV: 65.1 fL — ABNORMAL LOW (ref 80.0–100.0)
Monocytes Absolute: 0.6 10*3/uL (ref 0.1–1.0)
Monocytes Relative: 7 %
Neutro Abs: 3.1 10*3/uL (ref 1.7–7.7)
Neutrophils Relative %: 38 %
Platelets: 391 10*3/uL (ref 150–400)
RBC: 4.82 MIL/uL (ref 3.87–5.11)
RDW: 17.6 % — ABNORMAL HIGH (ref 11.5–15.5)
WBC: 8.2 10*3/uL (ref 4.0–10.5)
nRBC: 0 % (ref 0.0–0.2)

## 2020-01-26 LAB — URINALYSIS, ROUTINE W REFLEX MICROSCOPIC
Bilirubin Urine: NEGATIVE
Glucose, UA: NEGATIVE mg/dL
Ketones, ur: NEGATIVE mg/dL
Leukocytes,Ua: NEGATIVE
Nitrite: NEGATIVE
Protein, ur: NEGATIVE mg/dL
Specific Gravity, Urine: 1.025 (ref 1.005–1.030)
pH: 6 (ref 5.0–8.0)

## 2020-01-26 LAB — BASIC METABOLIC PANEL
Anion gap: 9 (ref 5–15)
BUN: 16 mg/dL (ref 8–23)
CO2: 26 mmol/L (ref 22–32)
Calcium: 9 mg/dL (ref 8.9–10.3)
Chloride: 106 mmol/L (ref 98–111)
Creatinine, Ser: 1.13 mg/dL — ABNORMAL HIGH (ref 0.44–1.00)
GFR, Estimated: 54 mL/min — ABNORMAL LOW (ref >60)
Glucose, Bld: 111 mg/dL — ABNORMAL HIGH (ref 70–99)
Potassium: 4.1 mmol/L (ref 3.5–5.1)
Sodium: 141 mmol/L (ref 135–145)

## 2020-01-26 LAB — URINALYSIS, MICROSCOPIC (REFLEX)

## 2020-01-26 LAB — RESPIRATORY PANEL BY RT PCR (FLU A&B, COVID)
Influenza A by PCR: NEGATIVE
Influenza B by PCR: NEGATIVE
SARS Coronavirus 2 by RT PCR: NEGATIVE

## 2020-01-26 IMAGING — MR MR MRA HEAD W/O CM
2 series · 17 of 48 positions shown · non-contrast
Comparison: None.

CLINICAL DATA: TIA

EXAM:
MRI HEAD WITHOUT CONTRAST
MRA HEAD WITHOUT CONTRAST
TECHNIQUE: Multiplanar, multiecho pulse sequences of the brain and surrounding
structures were obtained without intravenous contrast. Angiographic
images of the head were obtained using MRA technique without
contrast.

[Series 5: 3d cow · axial · 0.5mm · 0.41mm/px · z∈[-112,-31]mm · 16 of 172 slices shown]
[im 1/172]
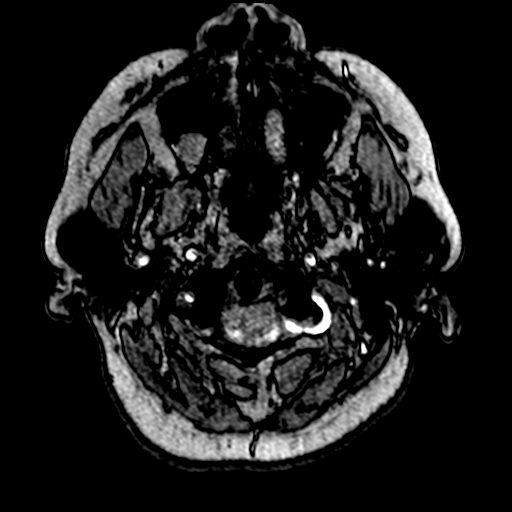
[im 4/172]
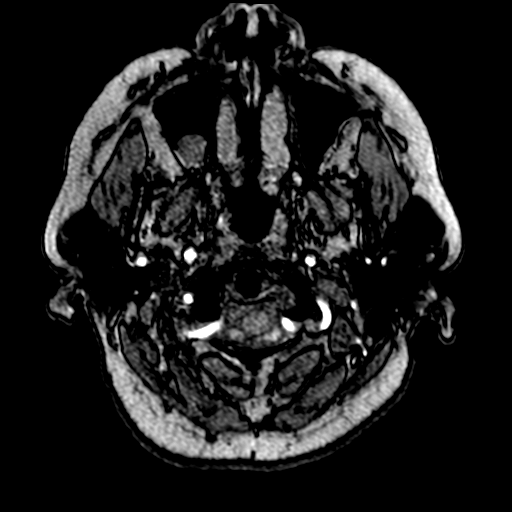
[im 8/172]
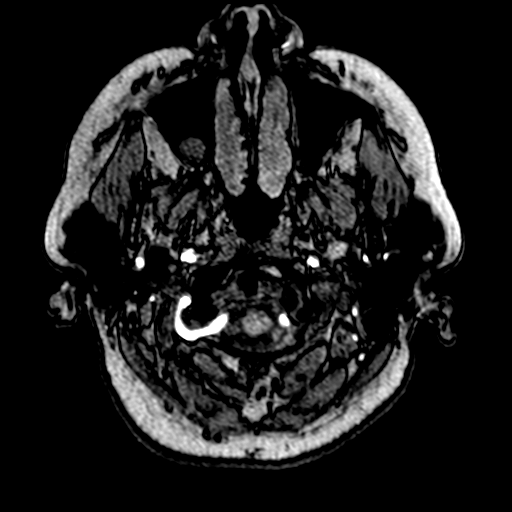
[im 12/172]
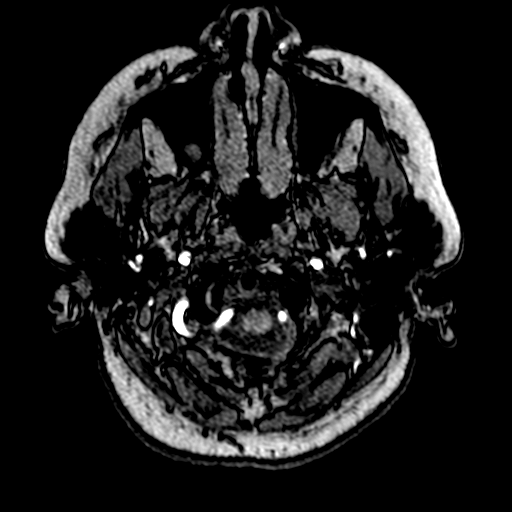
[im 15/172]
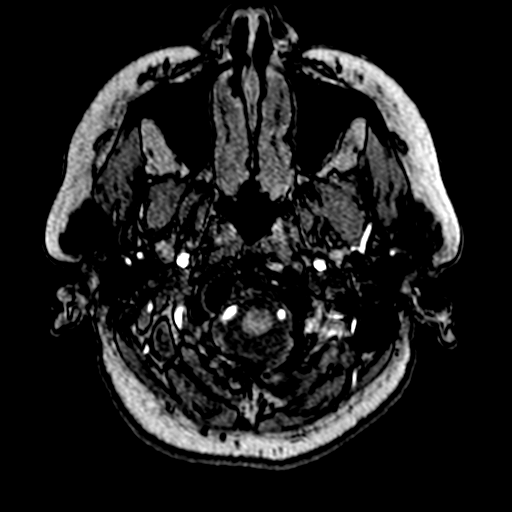
[im 19/172]
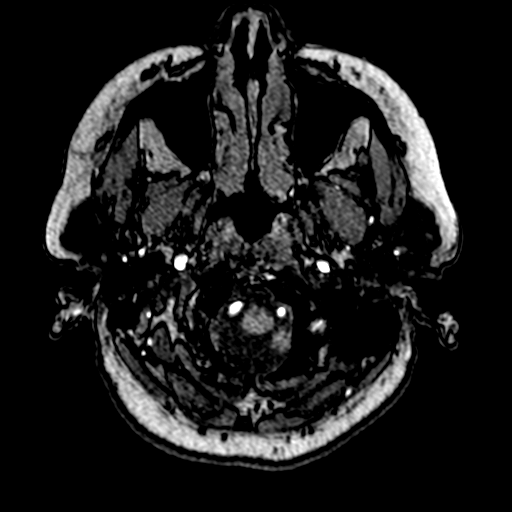
[im 27/172]
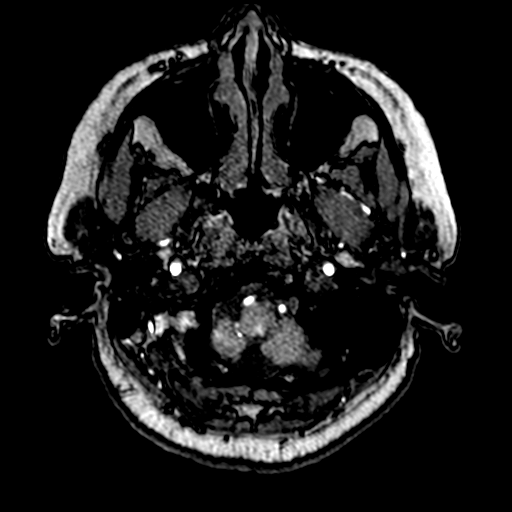
[im 30/172]
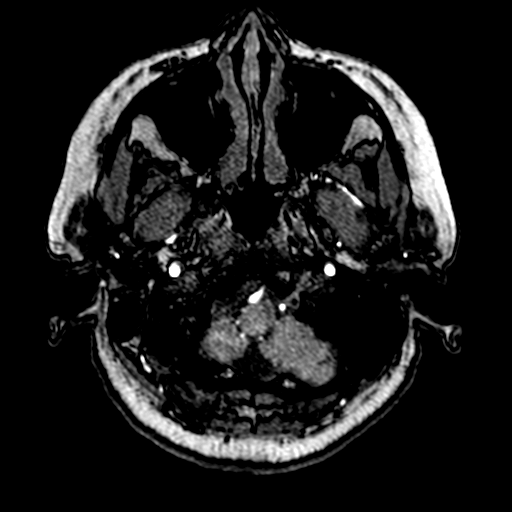
[im 53/172]
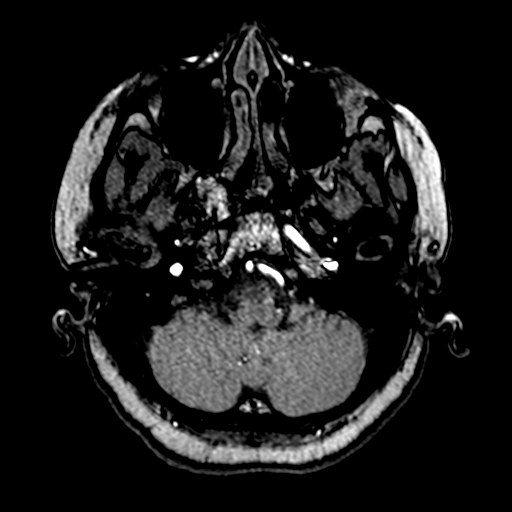
[im 75/172]
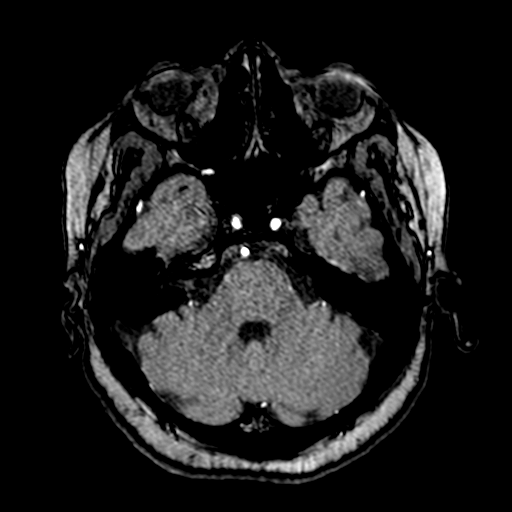
[im 86/172]
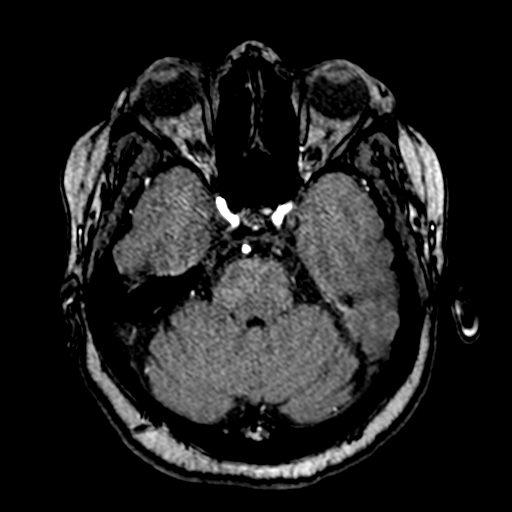
[im 97/172]
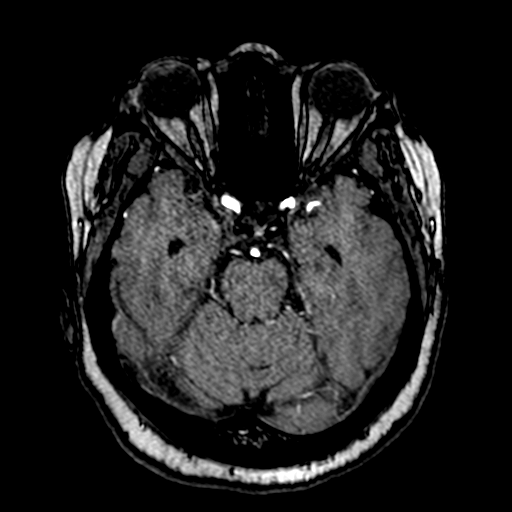
[im 119/172]
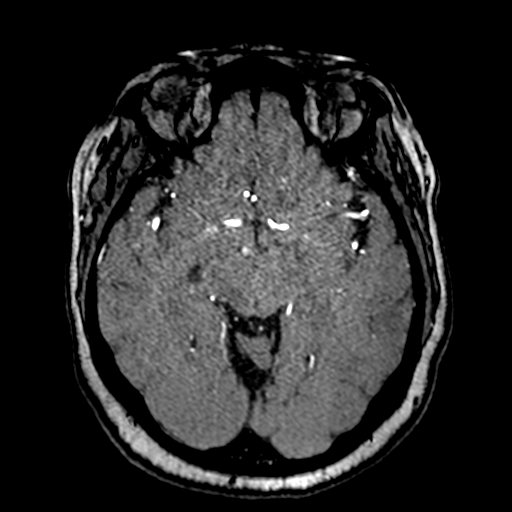
[im 142/172]
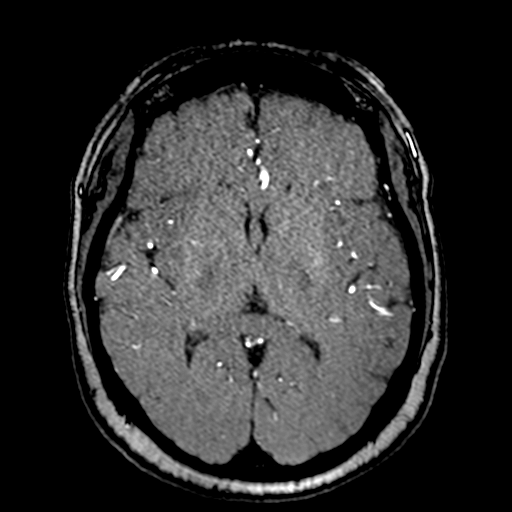
[im 145/172]
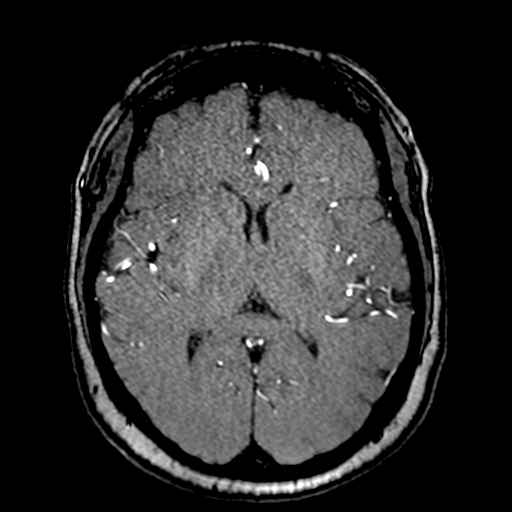
[im 164/172]
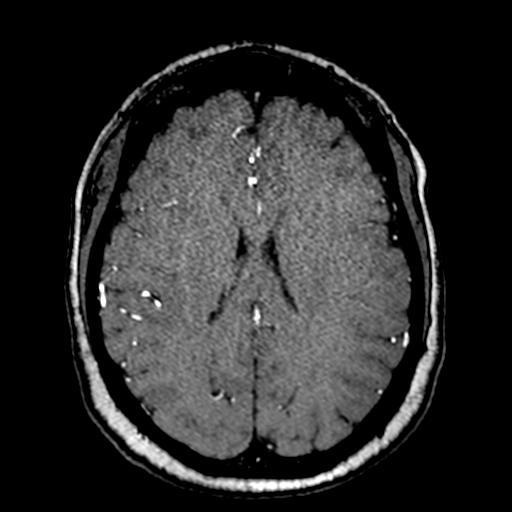

[Series 1046: mip tumble · axial · 0.4mm · 0.20mm/px · 1 of 3 slices shown]
[im 1/3]
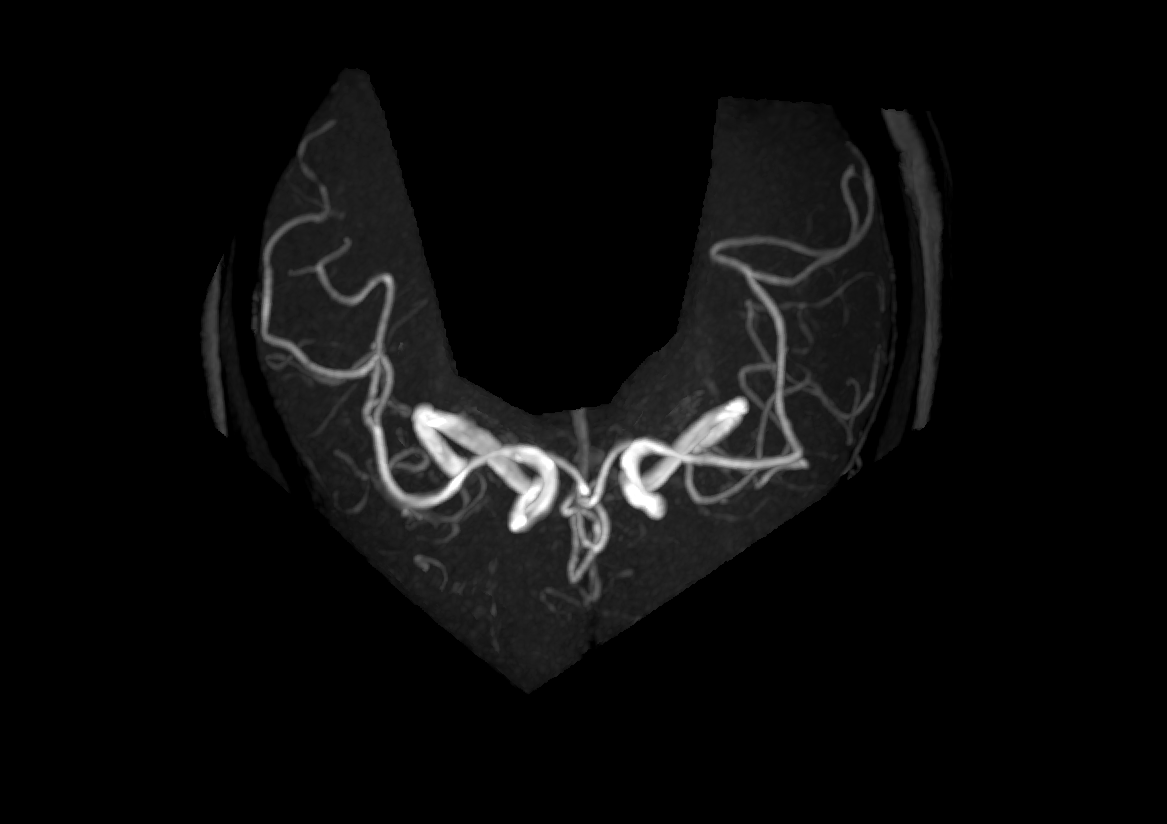

[17 of 48 positions shown; findings below may reference images not displayed]

FINDINGS: MRI HEAD

Brain: There is no acute infarction or intracranial hemorrhage.
There is no intracranial mass, mass effect, or edema. There is no
hydrocephalus or extra-axial fluid collection. Ventricles and sulci
are normal in size and configuration. Patchy small foci of T2
hyperintensity in the supratentorial white matter are nonspecific
but may reflect mild chronic microvascular ischemic changes.

Vascular: Major vessel flow voids at the skull base are preserved.

Skull and upper cervical spine: Normal marrow signal is preserved.

Sinuses/Orbits: Minor mucosal thickening.  Orbits are unremarkable.

Other: Sella is unremarkable.  Mastoid air cells are clear.

MRA HEAD

Intracranial internal carotid arteries are patent. Middle and
anterior cerebral arteries are patent. Intracranial vertebral
arteries, basilar artery, posterior cerebral arteries are patent.
There is no significant stenosis or aneurysm.
IMPRESSION: No acute infarction, hemorrhage, or mass. Mild chronic microvascular
ischemic changes.

No proximal intracranial vessel occlusion or significant stenosis.

## 2020-01-26 IMAGING — DX DG CHEST 2V
2 series · 2 of 2 positions shown · non-contrast
Comparison: None.

CLINICAL DATA: Left hand tingling

EXAM:
CHEST - 2 VIEW

[chest pa]
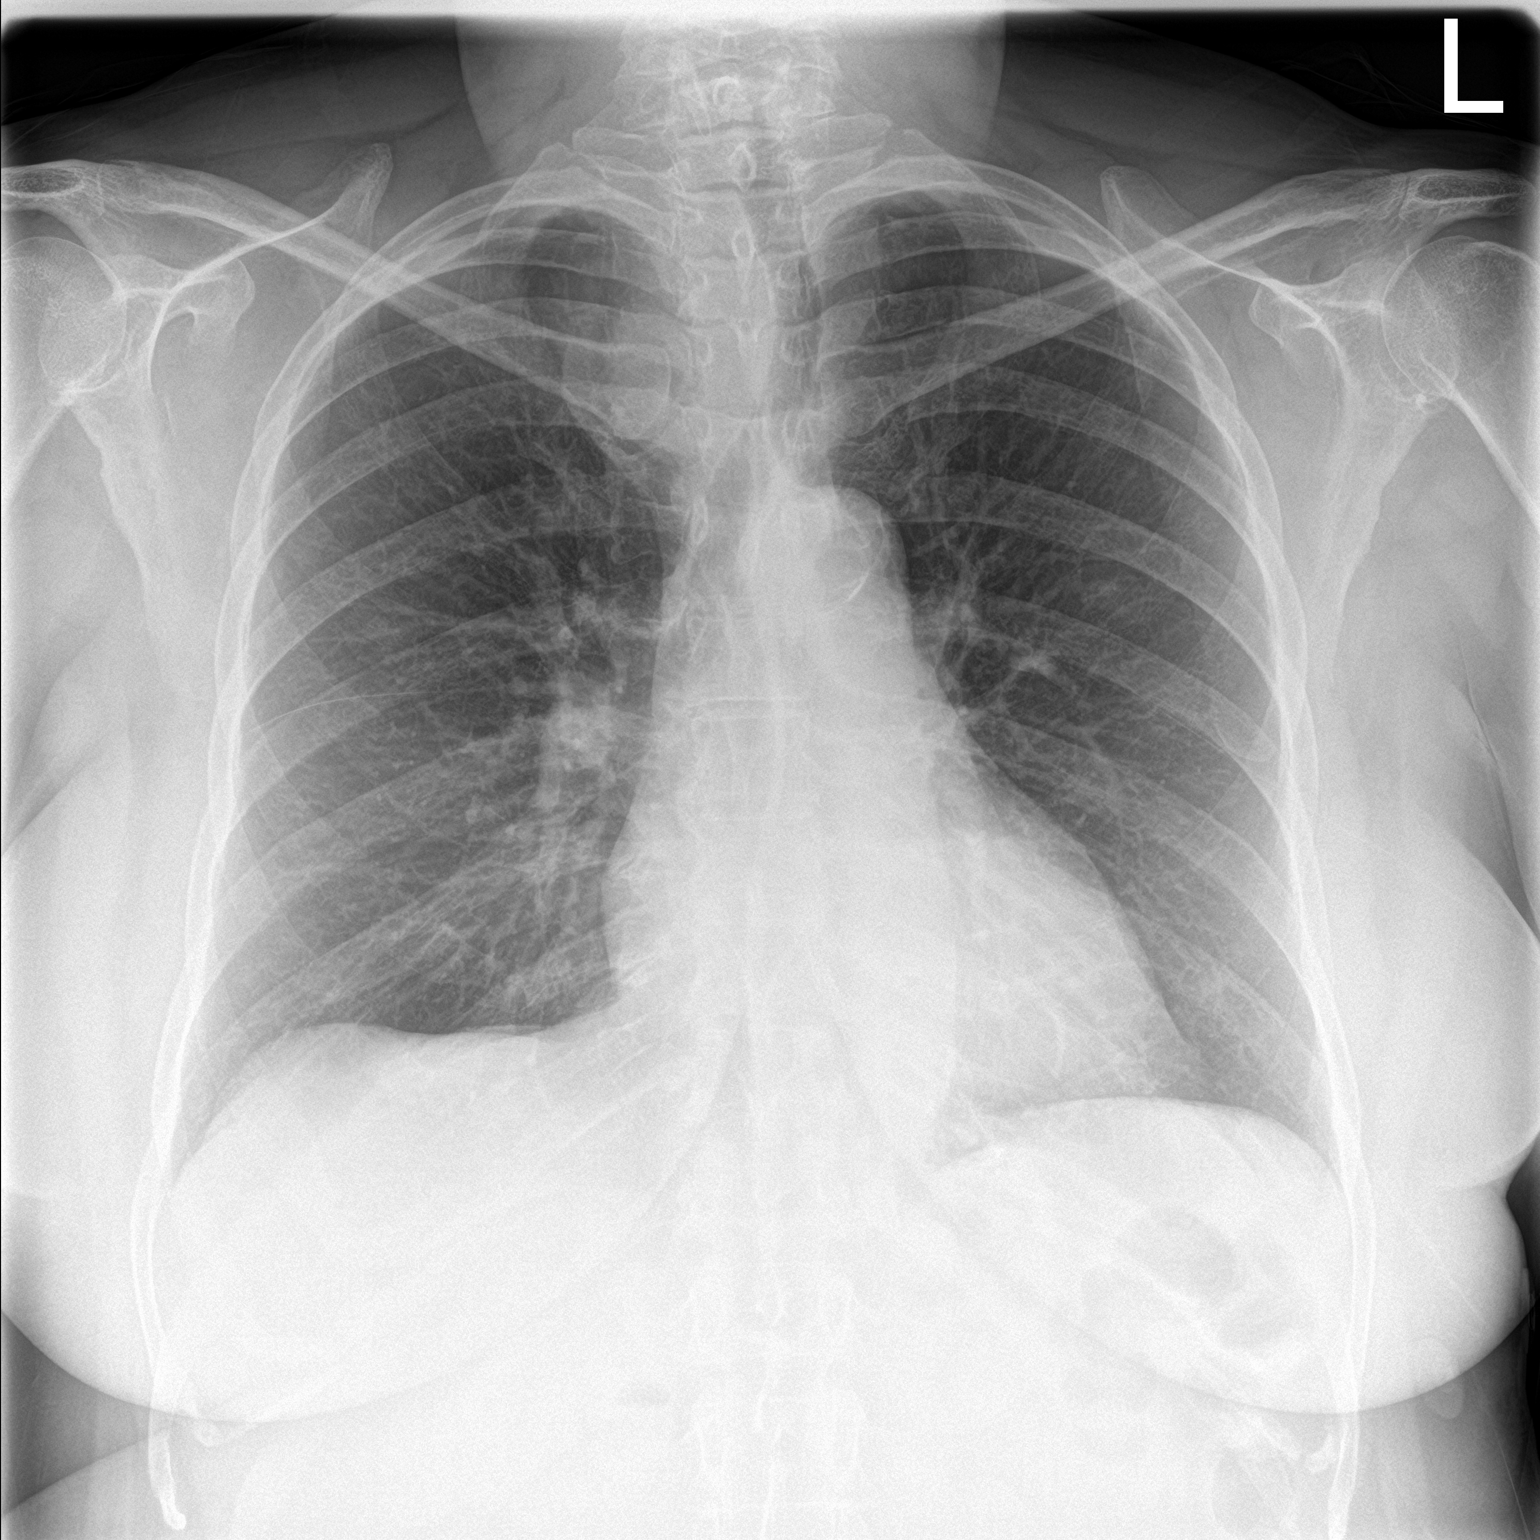

[chest lat]
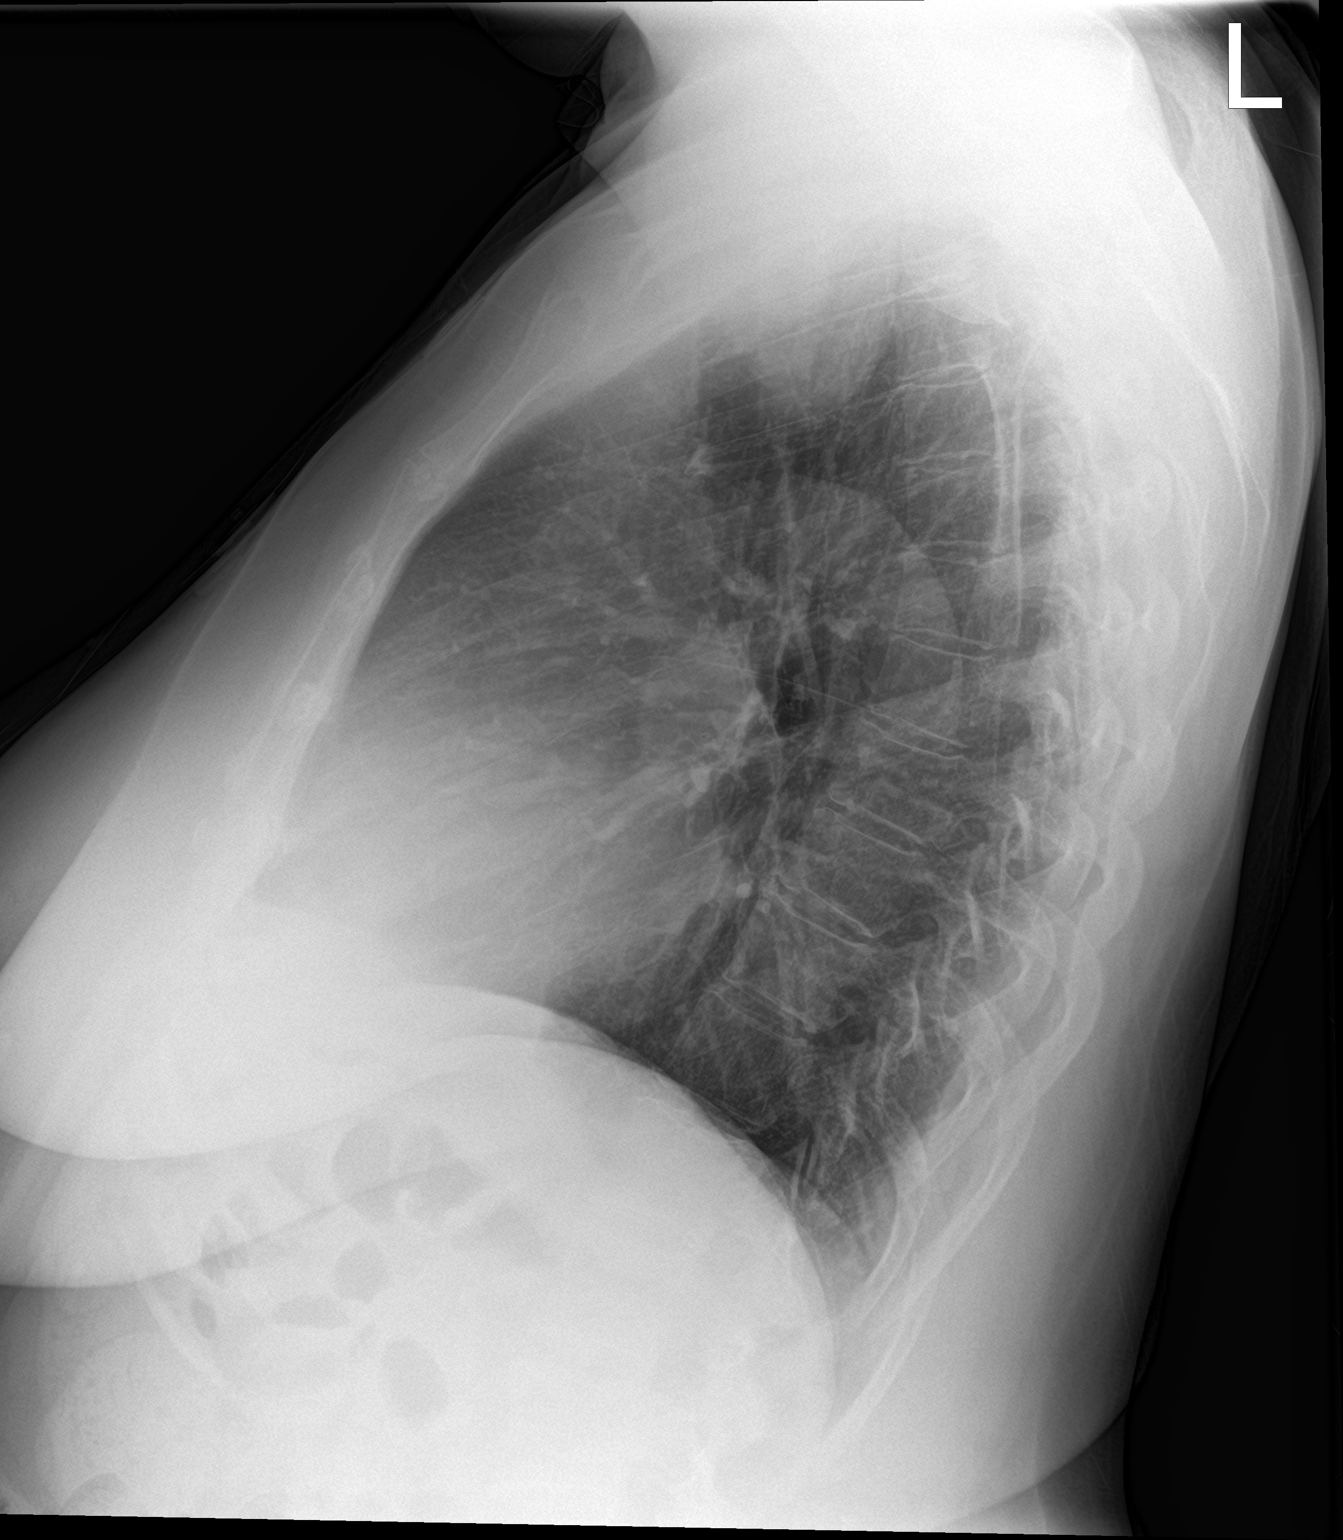

[2 of 2 positions shown; findings below may reference images not displayed]

FINDINGS: Cardiac shadow is within normal limits. Mild aortic calcifications
are noted. No focal infiltrate or sizable effusion is seen. No bony
abnormality is noted. Deviation of the trachea to the left is noted
secondary to known thyroid goiter. No other focal abnormality is
noted.
IMPRESSION: Deviation of the trachea consistent with the known goiter.

No acute abnormality noted.

## 2020-01-26 IMAGING — MR MR HEAD W/O CM
12 of 13 series · 44 of 48 positions shown · non-contrast
Comparison: None.

CLINICAL DATA: TIA

EXAM:
MRI HEAD WITHOUT CONTRAST
MRA HEAD WITHOUT CONTRAST
TECHNIQUE: Multiplanar, multiecho pulse sequences of the brain and surrounding
structures were obtained without intravenous contrast. Angiographic
images of the head were obtained using MRA technique without
contrast.

[Series 5: DWI · axial · 3.0mm · 0.88mm/px · z∈[-113,+33]mm · 8 of 100 slices shown (1 of 4)]
[im 1/100]
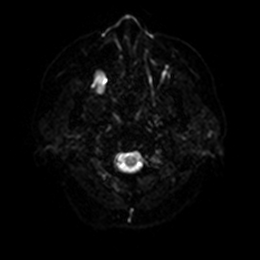
[im 15/100]
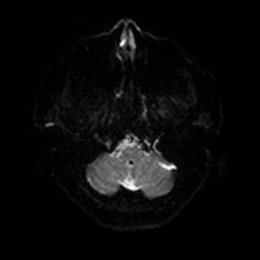
[im 29/100]
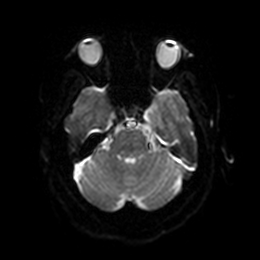
[im 43/100]
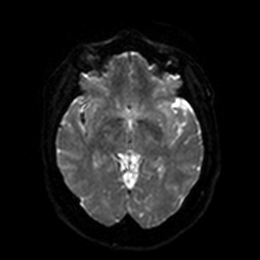
[im 57/100]
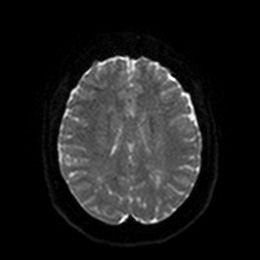
[im 71/100]
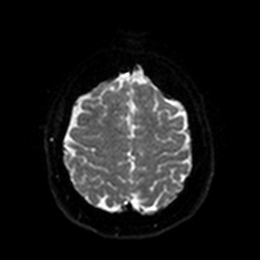
[im 85/100]
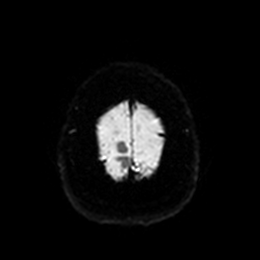
[im 100/100]
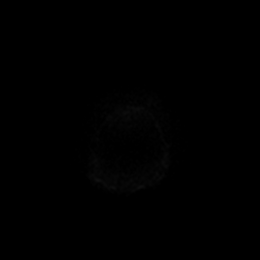

[Series 6: DWI · axial · 3.0mm · 0.88mm/px · z∈[-113,+33]mm · 4 of 50 slices shown (2 of 4)]
[im 1/50]
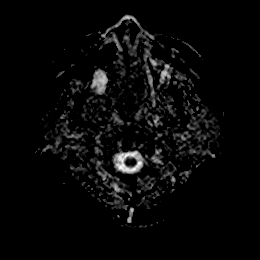
[im 17/50]
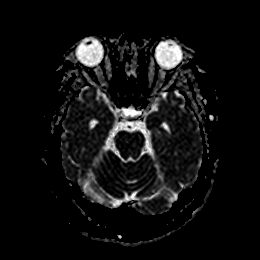
[im 33/50]
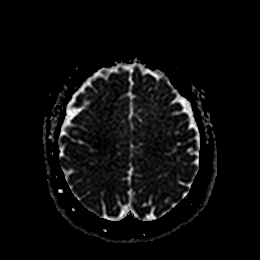
[im 50/50]
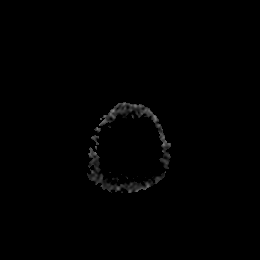

[Series 7: DWI · coronal · 4.0mm · 0.88mm/px · 5 of 68 slices shown (3 of 4)]
[im 1/68]
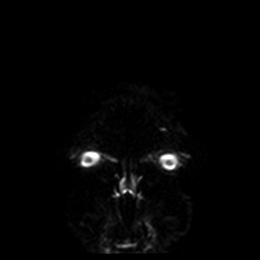
[im 17/68]
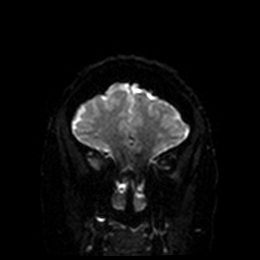
[im 34/68]
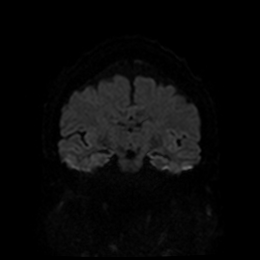
[im 51/68]
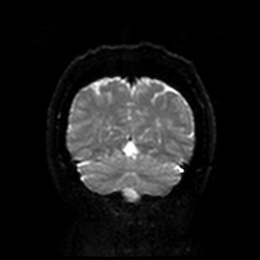
[im 68/68]
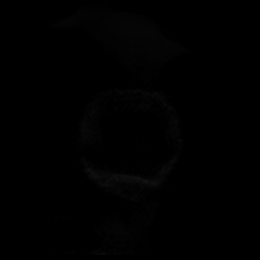

[Series 8: DWI · coronal · 4.0mm · 0.88mm/px · 3 of 34 slices shown (4 of 4)]
[im 1/34]
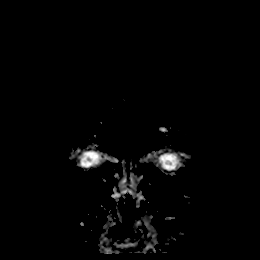
[im 17/34]
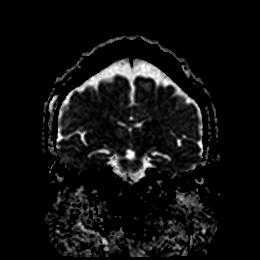
[im 34/34]
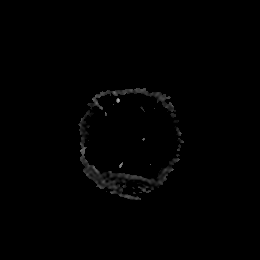

[Series 9: T1 · sagittal · 5.0mm · 0.75mm/px · 2 of 25 slices shown]
[im 1/25]
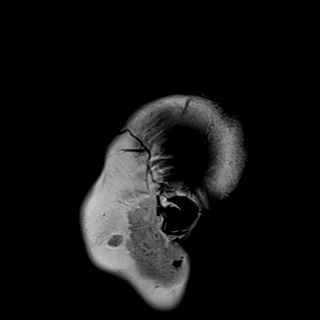
[im 25/25]
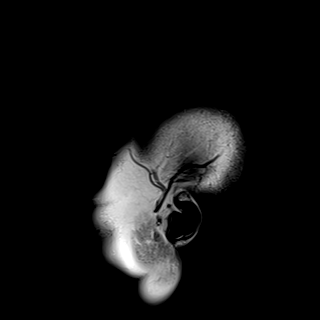

[Series 10: T2 · axial · 5.0mm · 0.72mm/px · z∈[-110,+33]mm · 2 of 25 slices shown (1 of 2)]
[im 1/25]
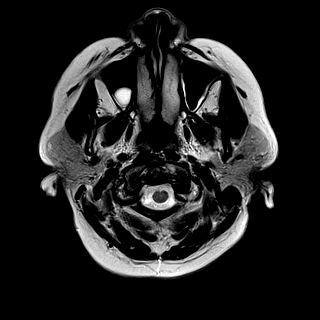
[im 25/25]
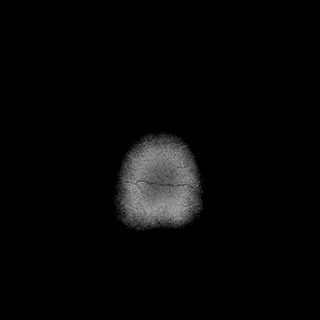

[Series 11: FLAIR · axial · 5.0mm · 0.45mm/px · z∈[-107,+35]mm · 2 of 25 slices shown]
[im 1/25]
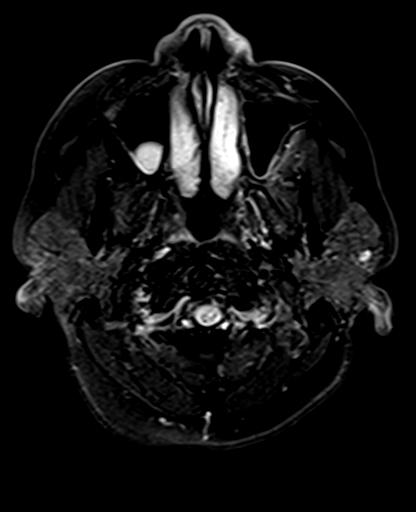
[im 25/25]
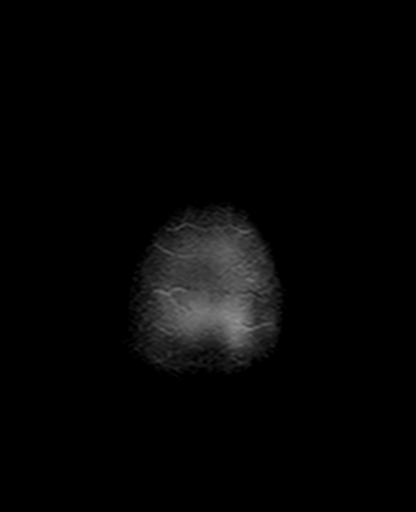

[Series 12: mag_images · axial · 3.0mm · 0.90mm/px · z∈[-109,+42]mm · 4 of 52 slices shown]
[im 1/52]
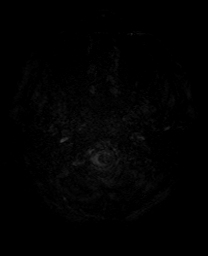
[im 18/52]
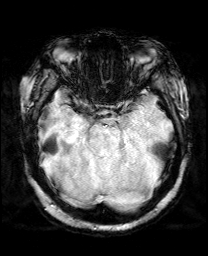
[im 35/52]
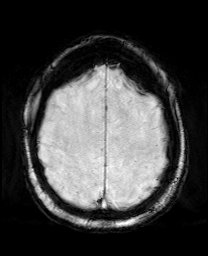
[im 52/52]
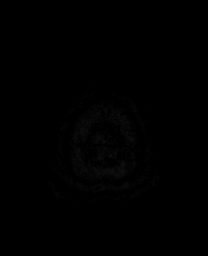

[Series 13: pha_images · axial · 3.0mm · 0.90mm/px · z∈[-109,+42]mm · 4 of 52 slices shown]
[im 1/52]
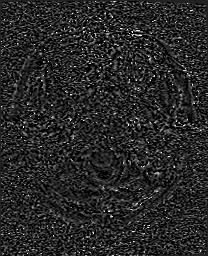
[im 18/52]
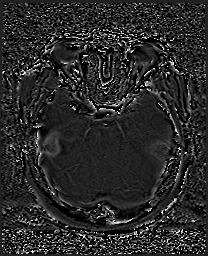
[im 35/52]
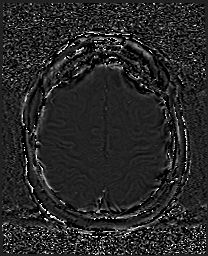
[im 52/52]
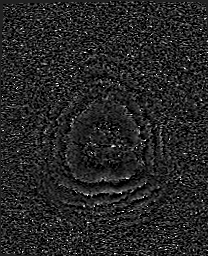

[Series 14: swi_images · axial · 3.0mm · 0.90mm/px · z∈[-109,+42]mm · 4 of 52 slices shown]
[im 1/52]
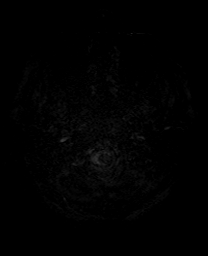
[im 18/52]
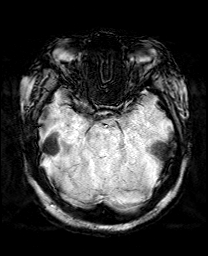
[im 35/52]
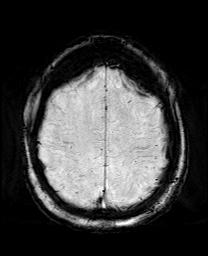
[im 52/52]
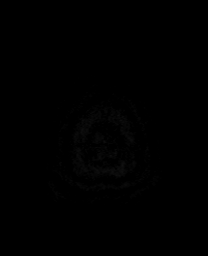

[Series 15: mip_images(sw) · axial · 24.0mm · 0.90mm/px · z∈[-99,+32]mm · 4 of 45 slices shown]
[im 1/45]
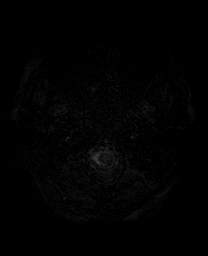
[im 15/45]
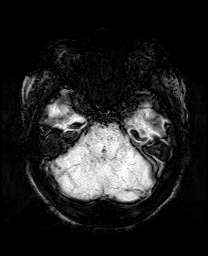
[im 30/45]
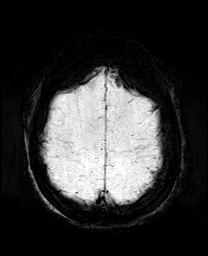
[im 45/45]
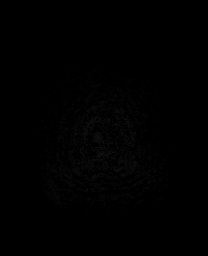

[Series 17: T2 · coronal · 5.0mm · 0.34mm/px · 2 of 29 slices shown (2 of 2)]
[im 1/29]
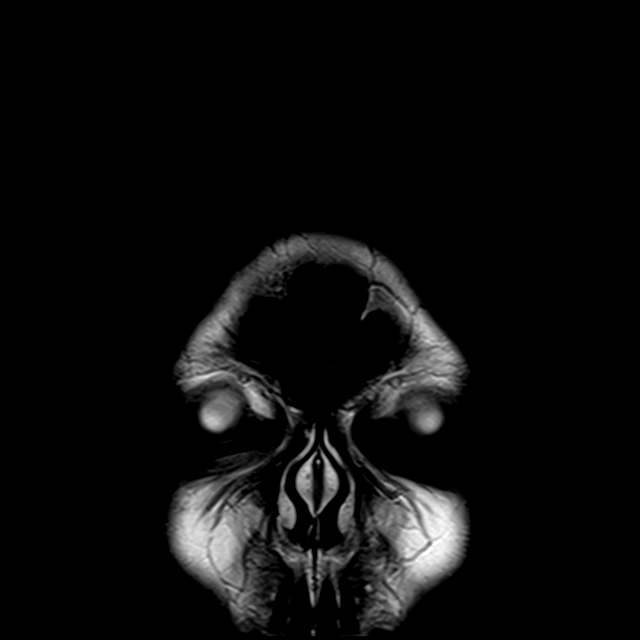
[im 29/29]
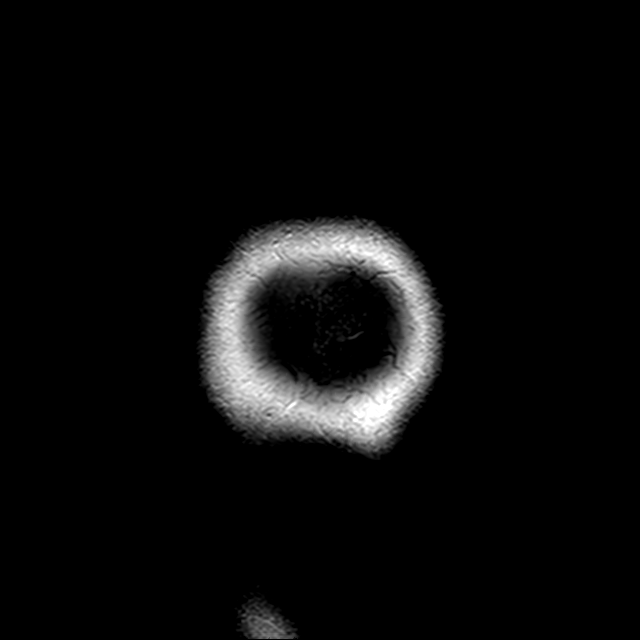

[44 of 48 positions shown; findings below may reference images not displayed]

FINDINGS: MRI HEAD

Brain: There is no acute infarction or intracranial hemorrhage.
There is no intracranial mass, mass effect, or edema. There is no
hydrocephalus or extra-axial fluid collection. Ventricles and sulci
are normal in size and configuration. Patchy small foci of T2
hyperintensity in the supratentorial white matter are nonspecific
but may reflect mild chronic microvascular ischemic changes.

Vascular: Major vessel flow voids at the skull base are preserved.

Skull and upper cervical spine: Normal marrow signal is preserved.

Sinuses/Orbits: Minor mucosal thickening.  Orbits are unremarkable.

Other: Sella is unremarkable.  Mastoid air cells are clear.

MRA HEAD

Intracranial internal carotid arteries are patent. Middle and
anterior cerebral arteries are patent. Intracranial vertebral
arteries, basilar artery, posterior cerebral arteries are patent.
There is no significant stenosis or aneurysm.
IMPRESSION: No acute infarction, hemorrhage, or mass. Mild chronic microvascular
ischemic changes.

No proximal intracranial vessel occlusion or significant stenosis.

## 2020-01-26 MED ORDER — ASPIRIN 300 MG RE SUPP: 300.0000 mg | Freq: Every day | RECTAL | Status: DC

## 2020-01-26 MED ORDER — SENNOSIDES-DOCUSATE SODIUM 8.6-50 MG PO TABS: 1.0000 | ORAL_TABLET | Freq: Every evening | ORAL | Status: DC | PRN

## 2020-01-26 MED ORDER — ACETAMINOPHEN 325 MG PO TABS: 650.0000 mg | ORAL_TABLET | ORAL | Status: DC | PRN

## 2020-01-26 MED ORDER — ACETAMINOPHEN 160 MG/5ML PO SOLN: 650.0000 mg | ORAL | Status: DC | PRN

## 2020-01-26 MED ORDER — SODIUM CHLORIDE 0.9 % IV SOLN: INTRAVENOUS | Status: AC

## 2020-01-26 MED ORDER — ENOXAPARIN SODIUM 40 MG/0.4ML ~~LOC~~ SOLN
40.0000 mg | SUBCUTANEOUS | Status: DC
  Administered 2020-01-26: 40 mg via SUBCUTANEOUS
  Filled 2020-01-26: qty 0.4

## 2020-01-26 MED ORDER — STROKE: EARLY STAGES OF RECOVERY BOOK
Freq: Once | Status: AC
  Filled 2020-01-26: qty 1

## 2020-01-26 MED ORDER — ACETAMINOPHEN 650 MG RE SUPP: 650.0000 mg | RECTAL | Status: DC | PRN

## 2020-01-26 MED ORDER — ASPIRIN 325 MG PO TABS
325.0000 mg | ORAL_TABLET | Freq: Every day | ORAL | Status: DC
  Administered 2020-01-26 – 2020-01-27 (×2): 325 mg via ORAL
  Filled 2020-01-26 (×2): qty 1

## 2020-01-26 NOTE — H&P (Signed)
TRH H&P    Patient Demographics:    Brenda Brock, is a 64 y.o. female  MRN: [id number]  DOB - 1956-03-20  Admit Date - 01/25/2020  Referring MD/NP/PA: Dr. Read Drivers  Outpatient Primary MD for the patient is [place], Betty G, MD  Patient coming from: Home  Chief complaint-left-sided numbness   HPI:    Brenda Brock  is a 64 y.o. female, with history of carpal tunnel syndrome involving right hand, and no other significant medical problems came to med [facility] with chief complaint of paresthesia in her left hand which started about 3 PM.  Patient states that she thought it was tight fitting bra strap which was causing this numbness she removed her bra and did not relieve the symptoms.  Patient also developed mild paresthesia in left leg with some backache.  This morning when patient was ironing her shirt she Solik for her dots in her shirt which lost its color transiently.  Patient got alarmed.  She was also having episode of double vision while she looked at the wall clock.  The symptoms lasted only for few seconds.  Patient came to ED for further evaluation.  She denies slurred speech.  No other weakness of extremities.  In the ED CT head was unremarkable.  She has no previous history of stroke or seizures. Denies chest pain or shortness of breath. No nausea vomiting or diarrhea No history of CAD    Review of systems:    In addition to the HPI above,  .  All other systems reviewed and are negative.    Past History of the following :    No pertinent medical or surgical history   Social History:      Social History   Tobacco Use  . Smoking status: Never Smoker  . Smokeless tobacco: Never Used  Substance Use Topics  . Alcohol use: No       Family History :   Mother has history of kidney cancer   Home Medications:   Prior to Admission medications   Medication Sig Start Date End Date  Taking? Authorizing Provider  ferrous sulfate 325 (65 FE) MG tablet Take 325 mg by mouth daily with breakfast.   Yes [provider]  cephALEXin (KEFLEX) 500 MG capsule Take 2 capsules (1,000 mg total) by mouth 2 (two) times daily. 04/12/15   Lawyer, Cristal Deer, PA-C  HYDROcodone-acetaminophen (NORCO/VICODIN) 5-325 MG tablet Take 1 tablet by mouth every 6 (six) hours as needed for moderate pain. 04/12/15   Lawyer, Cristal Deer, PA-C  predniSONE (DELTASONE) 50 MG tablet Take 1 tablet (50 mg total) by mouth daily. 04/12/15   Lawyer, Cristal Deer, PA-C     Allergies:    No Known Allergies   Physical Exam:   Vitals  Blood pressure 132/69, pulse 70, temperature 98.1 F (36.7 C), temperature source Oral, resp. rate 20, height 5\' 7"  (1.702 m), weight 96 kg, SpO2 100 %.  1.  General: Appears in no acute distress  2. Psychiatric: Alert, oriented x3, intact insight and judgment  3.  Neurologic: Cranial nerves II through XII grossly intact, no focal deficit noted, motor strength 5/5 in all extremities, sensations intact bilaterally, finger-to-nose intact both sides  4. HEENMT:  Atraumatic normocephalic, extraocular muscles are intact  5. Respiratory : Clear to auscultation bilaterally, no wheezing crackles auscultated  6. Cardiovascular : S1-S2, regular, no murmur auscultated  7. Gastrointestinal:  Abdomen is soft, nontender, no organomegaly  8.  Musculoskeletal Tinel sign positive, Phalen test positive in left hand    Data Review:    CBC Recent Labs  Lab 01/26/20 0014  WBC 8.2  HGB 9.6*  HCT 31.4*  PLT 391  MCV 65.1*  MCH 19.9*  MCHC 30.6  RDW 17.6*  LYMPHSABS 4.3*  MONOABS 0.6  EOSABS 0.1  BASOSABS 0.0   ------------------------------------------------------------------------------------------------------------------  Results for orders placed or performed during the hospital encounter of 01/25/20 (from the past 48 hour(s))  CBC with Differential      Status: Abnormal   Collection Time: 01/26/20 12:14 AM  Result Value Ref Range   WBC 8.2 4.0 - 10.5 K/uL   RBC 4.82 3.87 - 5.11 MIL/uL   Hemoglobin 9.6 (L) 12.0 - 15.0 g/dL   HCT 12.8 (L) 36 - 46 %   MCV 65.1 (L) 80.0 - 100.0 fL   MCH 19.9 (L) 26.0 - 34.0 pg   MCHC 30.6 30.0 - 36.0 g/dL   RDW 78.6 (H) 76.7 - 20.9 %   Platelets 391 150 - 400 K/uL   nRBC 0.0 0.0 - 0.2 %   Neutrophils Relative % 38 %   Neutro Abs 3.1 1.7 - 7.7 K/uL   Lymphocytes Relative 52 %   Lymphs Abs 4.3 (H) 0.7 - 4.0 K/uL   Monocytes Relative 7 %   Monocytes Absolute 0.6 0.1 - 1.0 K/uL   Eosinophils Relative 2 %   Eosinophils Absolute 0.1 0.0 - 0.5 K/uL   Basophils Relative 1 %   Basophils Absolute 0.0 0.0 - 0.1 K/uL   Immature Granulocytes 0 %   Abs Immature Granulocytes 0.02 0.00 - 0.07 K/uL    Comment: Performed at [facility], [address] [place], [place] [address]  Basic metabolic panel     Status: Abnormal   Collection Time: 01/26/20 12:14 AM  Result Value Ref Range   Sodium 141 135 - 145 mmol/L   Potassium 4.1 3.5 - 5.1 mmol/L   Chloride 106 98 - 111 mmol/L   CO2 26 22 - 32 mmol/L   Glucose, Bld 111 (H) 70 - 99 mg/dL    Comment: Glucose reference range applies only to samples taken after fasting for at least 8 hours.   BUN 16 8 - 23 mg/dL   Creatinine, Ser 2.83 (H) 0.44 - 1.00 mg/dL   Calcium 9.0 8.9 - 66.2 mg/dL   GFR, Estimated 54 (L) >60 mL/min    Comment: (NOTE) Calculated using the CKD-EPI Creatinine Equation (2021)    Anion gap 9 5 - 15    Comment: Performed at [facility], [address] [place], [place] [address]  Respiratory Panel by RT PCR (Flu A&B, Covid) - Nasopharyngeal Swab     Status: None   Collection Time: 01/26/20  2:52 AM   Specimen: Nasopharyngeal Swab  Result Value Ref Range   SARS Coronavirus 2 by RT PCR NEGATIVE NEGATIVE    Comment: (NOTE) SARS-CoV-2 target nucleic acids are NOT DETECTED.  The SARS-CoV-2 RNA is generally detectable in  upper respiratoy specimens during the acute phase  of infection. The lowest concentration of SARS-CoV-2 viral copies this assay can detect is 131 copies/mL. A negative result does not preclude SARS-Cov-2 infection and should not be used as the sole basis for treatment or other patient management decisions. A negative result may occur with  improper specimen collection/handling, submission of specimen other than nasopharyngeal swab, presence of viral mutation(s) within the areas targeted by this assay, and inadequate number of viral copies (<131 copies/mL). A negative result must be combined with clinical observations, patient history, and epidemiological information. The expected result is Negative.  Fact Sheet for Patients:  https://www.moore.com/  Fact Sheet for Healthcare Providers:  https://www.young.biz/  This test is no t yet approved or cleared by the [place] FDA and  has been authorized for detection and/or diagnosis of SARS-CoV-2 by FDA under an Emergency Use Authorization (EUA). This EUA will remain  in effect (meaning this test can be used) for the duration of the COVID-19 declaration under Section 564(b)(1) of the Act, 21 U.S.C. section 360bbb-3(b)(1), unless the authorization is terminated or revoked sooner.     Influenza A by PCR NEGATIVE NEGATIVE   Influenza B by PCR NEGATIVE NEGATIVE    Comment: (NOTE) The Xpert Xpress SARS-CoV-2/FLU/RSV assay is intended as an aid in  the diagnosis of influenza from Nasopharyngeal swab specimens and  should not be used as a sole basis for treatment. Nasal washings and  aspirates are unacceptable for Xpert Xpress SARS-CoV-2/FLU/RSV  testing.  Fact Sheet for Patients: https://www.moore.com/  Fact Sheet for Healthcare Providers: https://www.young.biz/  This test is not yet approved or cleared by the [place] FDA and  has been authorized for  detection and/or diagnosis of SARS-CoV-2 by  FDA under an Emergency Use Authorization (EUA). This EUA will remain  in effect (meaning this test can be used) for the duration of the  Covid-19 declaration under Section 564(b)(1) of the Act, 21  U.S.C. section 360bbb-3(b)(1), unless the authorization is  terminated or revoked. Performed at [facility], [address] [place], [place] [address]   Urinalysis, Routine w reflex microscopic Urine, Clean Catch     Status: Abnormal   Collection Time: 01/26/20  2:59 AM  Result Value Ref Range   Color, Urine YELLOW YELLOW   APPearance CLEAR CLEAR   Specific Gravity, Urine 1.025 1.005 - 1.030   pH 6.0 5.0 - 8.0   Glucose, UA NEGATIVE NEGATIVE mg/dL   Hgb urine dipstick MODERATE (A) NEGATIVE   Bilirubin Urine NEGATIVE NEGATIVE   Ketones, ur NEGATIVE NEGATIVE mg/dL   Protein, ur NEGATIVE NEGATIVE mg/dL   Nitrite NEGATIVE NEGATIVE   Leukocytes,Ua NEGATIVE NEGATIVE    Comment: Performed at [facility], [address] [place], [place] [address]  Urinalysis, Microscopic (reflex)     Status: Abnormal   Collection Time: 01/26/20  2:59 AM  Result Value Ref Range   RBC / HPF 6-10 0 - 5 RBC/hpf   WBC, UA 0-5 0 - 5 WBC/hpf   Bacteria, UA FEW (A) NONE SEEN   Squamous Epithelial / LPF 0-5 0 - 5    Comment: Performed at [facility], [address] [place], [place] [address]    Chemistries  Recent Labs  Lab 01/26/20 0014  NA 141  K 4.1  CL 106  CO2 26  GLUCOSE 111*  BUN 16  CREATININE 1.13*  CALCIUM 9.0   ------------------------------------------------------------------------------------------------------------------   --------------------------------------------------------------------------------------------------------------- Urine analysis:    Component Value Date/Time  COLORURINE YELLOW 01/26/2020 0259   APPEARANCEUR CLEAR 01/26/2020 0259   LABSPEC 1.025 01/26/2020 0259   PHURINE 6.0  01/26/2020 0259   GLUCOSEU NEGATIVE 01/26/2020 0259   HGBUR MODERATE (A) 01/26/2020 0259   BILIRUBINUR NEGATIVE 01/26/2020 0259   KETONESUR NEGATIVE 01/26/2020 0259   PROTEINUR NEGATIVE 01/26/2020 0259   NITRITE NEGATIVE 01/26/2020 0259   LEUKOCYTESUR NEGATIVE 01/26/2020 0259      Imaging Results:    CT Head Wo Contrast  Result Date: 01/25/2020 CLINICAL DATA:  Blurred vision, left leg tingling and pain, headache EXAM: CT HEAD WITHOUT CONTRAST TECHNIQUE: Contiguous axial images were obtained from the base of the skull through the vertex without intravenous contrast. COMPARISON:  None. FINDINGS: Brain: No acute infarct or hemorrhage. Lateral ventricles and midline structures are unremarkable. No acute extra-axial fluid collections. No mass effect. Vascular: No hyperdense vessel or unexpected calcification. Skull: Normal. Negative for fracture or focal lesion. Sinuses/Orbits: No acute finding. Other: None. IMPRESSION: 1. No acute intracranial process. Electronically Signed   By: Sharlet SalinaMichael  Brown M.D.   On: 01/25/2020 23:24    My personal review of EKG: Rhythm NSR, no ST-T changes   Assessment & Plan:    Active Problems:   TIA (transient ischemic attack)   Left sided numbness   1. Left-sided numbness-TIA versus stroke, will admit patient to hospital for TIA versus stroke work-up.  Obtain echocardiogram, carotid Dopplers, lipid profile, hemoglobin A1c.  Check MRI brain/MRA head.  Start aspirin 325 mg daily.  Neurochecks, PT/OT eval. 2. Carpal tunnel syndrome-patient has positive Tinel sign in left hand, consistent with carpal tunnel syndrome.  She does have history of carpal tunnel in her right hand in the past.  She can follow-up with orthopedics as outpatient. 3. Mild AKI-likely dehydration.  Patient's creatinine is 1.13, up from 0.81 in 2017.  Started on gentle IV normal saline at 75 mill per hour.  Follow BMP in am.    DVT Prophylaxis-   Lovenox   AM Labs Ordered, also please review  Full Orders  Family Communication: Admission, patients condition and plan of care including tests being ordered have been discussed with the patient who indicate understanding and agree with the plan and Code Status.  Code Status: Full code  Admission status: Inpatient :The appropriate admission status for this patient is INPATIENT. Inpatient status is judged to be reasonable and necessary in order to provide the required intensity of service to ensure the patient's safety. The patient's presenting symptoms, physical exam findings, and initial radiographic and laboratory data in the context of their chronic comorbidities is felt to place them at high risk for further clinical deterioration. Furthermore, it is not anticipated that the patient will be medically stable for discharge from the hospital within 2 midnights of admission. The following factors support the admission status of inpatient.     The patient's presenting symptoms include left-sided numbness. The worrisome physical exam findings include left-sided numbness. The initial radiographic and laboratory data are worrisome because of left-sided numbness. The chronic co-morbidities include obesity       * I certify that at the point of admission it is my clinical judgment that the patient will require inpatient hospital care spanning beyond 2 midnights from the point of admission due to high intensity of service, high risk for further deterioration and high frequency of surveillance required.*  Time spent in minutes : 60 minutes   Rasheka Denard S Iraida Cragin M.D

## 2020-01-26 NOTE — ED Notes (Signed)
Pt ambulatory with steady gait to restroom 

## 2020-01-26 NOTE — Progress Notes (Signed)
Received via [facility] from [facility]; patient is alert and oriented; oriented to room and unit routine; fall safety reviewed; bed is low and locked with alarm until patient is independent.

## 2020-01-26 NOTE — ED Notes (Signed)
Pt. Reports she has some numbness in the L hand and up the L arm.  Pt. Reports she can feel me touch the L arm upon assessment.

## 2020-01-26 NOTE — ED Notes (Signed)
No current sign & held order for review at this time.

## 2020-01-27 ENCOUNTER — Inpatient Hospital Stay (HOSPITAL_COMMUNITY): Payer: Federal, State, Local not specified - PPO

## 2020-01-27 ENCOUNTER — Inpatient Hospital Stay (HOSPITAL_BASED_OUTPATIENT_CLINIC_OR_DEPARTMENT_OTHER): Payer: Federal, State, Local not specified - PPO

## 2020-01-27 ENCOUNTER — Observation Stay (HOSPITAL_COMMUNITY): Payer: Federal, State, Local not specified - PPO

## 2020-01-27 DIAGNOSIS — M5417 Radiculopathy, lumbosacral region: Secondary | ICD-10-CM

## 2020-01-27 DIAGNOSIS — M50221 Other cervical disc displacement at C4-C5 level: Secondary | ICD-10-CM | POA: Diagnosis not present

## 2020-01-27 DIAGNOSIS — D509 Iron deficiency anemia, unspecified: Secondary | ICD-10-CM | POA: Diagnosis not present

## 2020-01-27 DIAGNOSIS — R200 Anesthesia of skin: Secondary | ICD-10-CM | POA: Diagnosis not present

## 2020-01-27 DIAGNOSIS — M47812 Spondylosis without myelopathy or radiculopathy, cervical region: Secondary | ICD-10-CM | POA: Diagnosis not present

## 2020-01-27 DIAGNOSIS — G459 Transient cerebral ischemic attack, unspecified: Secondary | ICD-10-CM

## 2020-01-27 DIAGNOSIS — G5603 Carpal tunnel syndrome, bilateral upper limbs: Secondary | ICD-10-CM | POA: Diagnosis not present

## 2020-01-27 DIAGNOSIS — Z8669 Personal history of other diseases of the nervous system and sense organs: Secondary | ICD-10-CM | POA: Diagnosis not present

## 2020-01-27 LAB — ECHOCARDIOGRAM COMPLETE
Area-P 1/2: 2.76 cm2
Calc EF: 62 %
Height: 67 in
S' Lateral: 3.4 cm
Single Plane A2C EF: 63.7 %
Single Plane A4C EF: 61.4 %
Weight: 3386.27 oz

## 2020-01-27 LAB — BASIC METABOLIC PANEL
Anion gap: 11 (ref 5–15)
BUN: 17 mg/dL (ref 8–23)
CO2: 26 mmol/L (ref 22–32)
Calcium: 9 mg/dL (ref 8.9–10.3)
Chloride: 104 mmol/L (ref 98–111)
Creatinine, Ser: 1.12 mg/dL — ABNORMAL HIGH (ref 0.44–1.00)
GFR, Estimated: 55 mL/min — ABNORMAL LOW (ref >60)
Glucose, Bld: 112 mg/dL — ABNORMAL HIGH (ref 70–99)
Potassium: 4 mmol/L (ref 3.5–5.1)
Sodium: 141 mmol/L (ref 135–145)

## 2020-01-27 LAB — LIPID PANEL
Cholesterol: 154 mg/dL (ref 0–200)
HDL: 46 mg/dL (ref >40)
LDL Cholesterol: 89 mg/dL (ref 0–99)
Total CHOL/HDL Ratio: 3.3 RATIO
Triglycerides: 94 mg/dL (ref <150)
VLDL: 19 mg/dL (ref 0–40)

## 2020-01-27 LAB — HEMOGLOBIN A1C
Hgb A1c MFr Bld: 6.1 % — ABNORMAL HIGH (ref 4.8–5.6)
Mean Plasma Glucose: 128.37 mg/dL

## 2020-01-27 LAB — CBC
HCT: 32.1 % — ABNORMAL LOW (ref 36.0–46.0)
Hemoglobin: 9.6 g/dL — ABNORMAL LOW (ref 12.0–15.0)
MCH: 19.6 pg — ABNORMAL LOW (ref 26.0–34.0)
MCHC: 29.9 g/dL — ABNORMAL LOW (ref 30.0–36.0)
MCV: 65.4 fL — ABNORMAL LOW (ref 80.0–100.0)
Platelets: 391 10*3/uL (ref 150–400)
RBC: 4.91 MIL/uL (ref 3.87–5.11)
RDW: 17.2 % — ABNORMAL HIGH (ref 11.5–15.5)
WBC: 7.8 10*3/uL (ref 4.0–10.5)
nRBC: 0 % (ref 0.0–0.2)

## 2020-01-27 LAB — HIV ANTIBODY (ROUTINE TESTING W REFLEX): HIV Screen 4th Generation wRfx: NONREACTIVE

## 2020-01-27 IMAGING — MR MR CERVICAL SPINE W/O CM
4 of 5 series · 19 of 48 positions shown · non-contrast
Comparison: None.

CLINICAL DATA: Acute or progressive numbness of the left hand.
Myelopathy.

EXAM:
MRI CERVICAL SPINE WITHOUT CONTRAST
TECHNIQUE: Multiplanar, multisequence MR imaging of the cervical spine was
performed. No intravenous contrast was administered.

[Series 2: T2 · sagittal · 3.0mm · 0.43mm/px · 6 of 16 slices shown (1 of 2)]
[im 1/16]
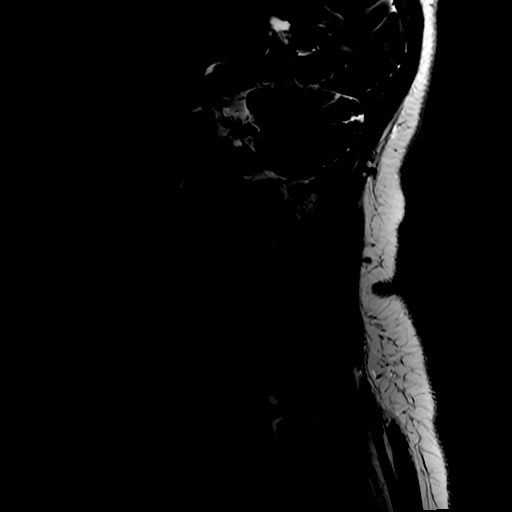
[im 4/16]
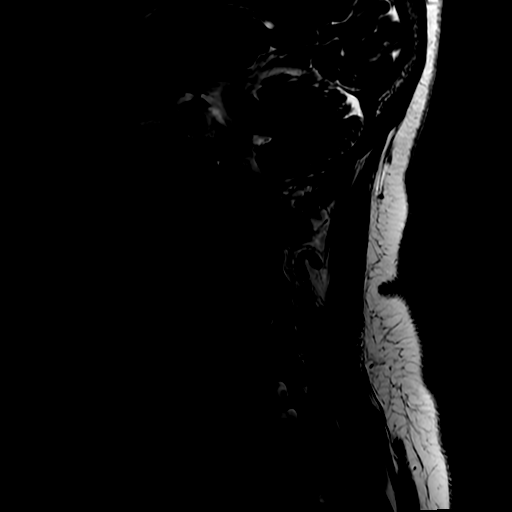
[im 7/16]
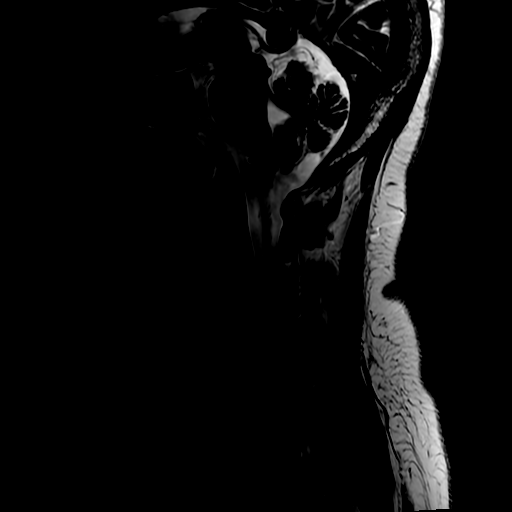
[im 10/16]
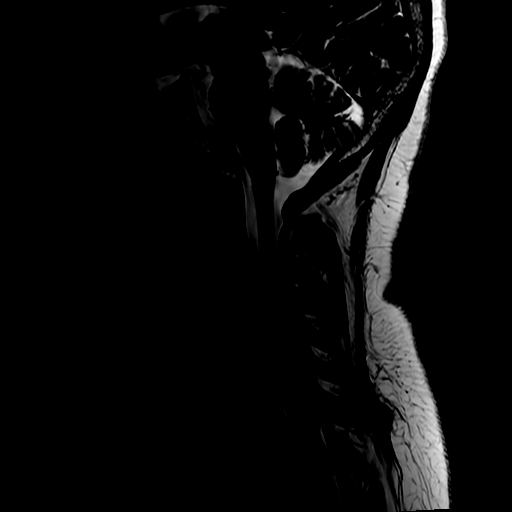
[im 13/16]
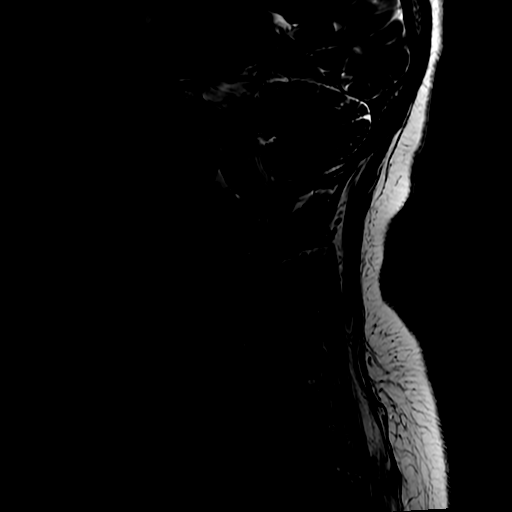
[im 16/16]
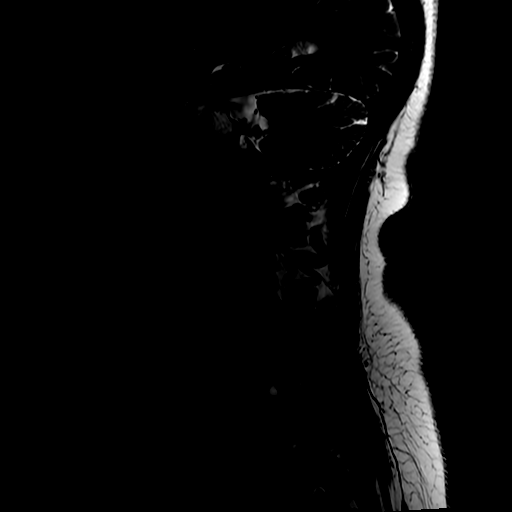

[Series 3: FLAIR · sagittal · 3.0mm · 0.43mm/px · 3 of 16 slices shown]
[im 1/16]
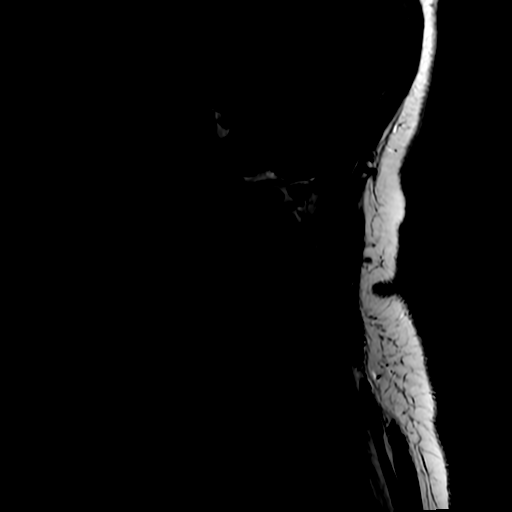
[im 8/16]
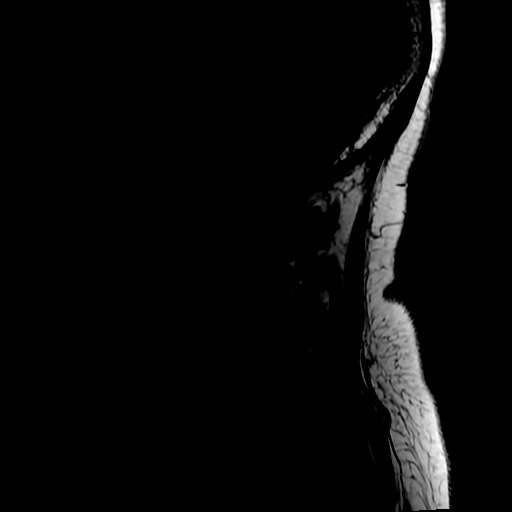
[im 16/16]
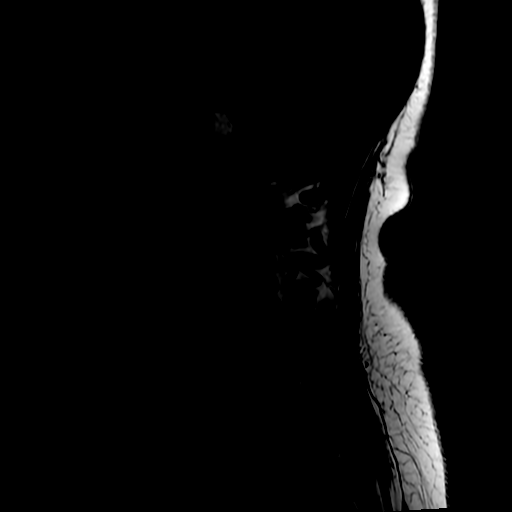

[Series 4: STIR · sagittal · 3.0mm · 0.43mm/px · 3 of 16 slices shown]
[im 1/16]
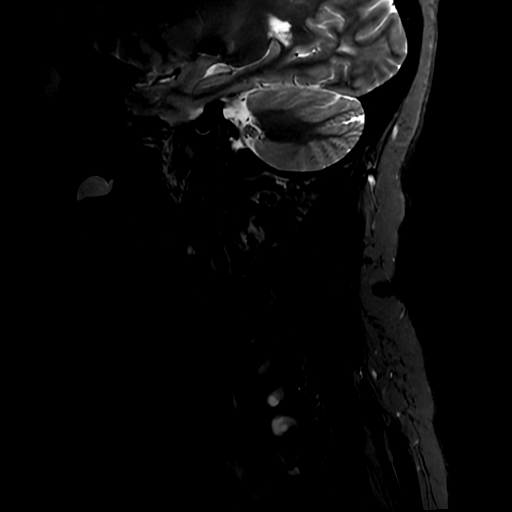
[im 8/16]
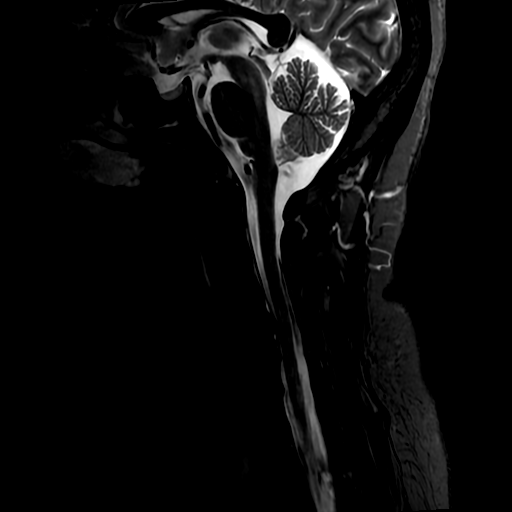
[im 16/16]
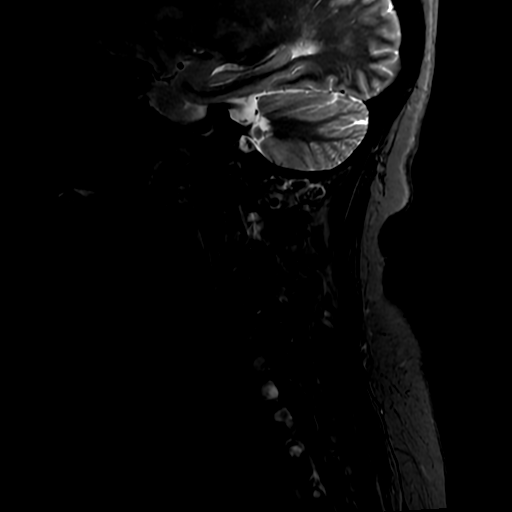

[Series 6: T2 · axial · 3.0mm · 0.35mm/px · z∈[-111,-47]mm · 7 of 26 slices shown (2 of 2)]
[im 1/26]
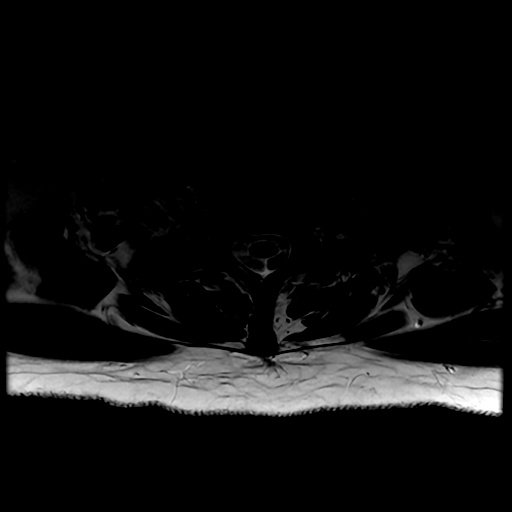
[im 4/26]
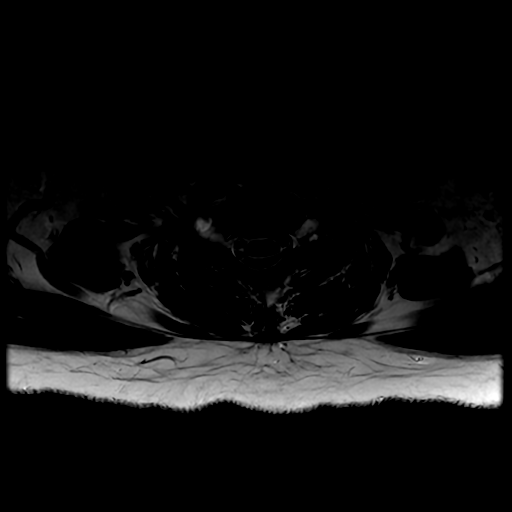
[im 8/26]
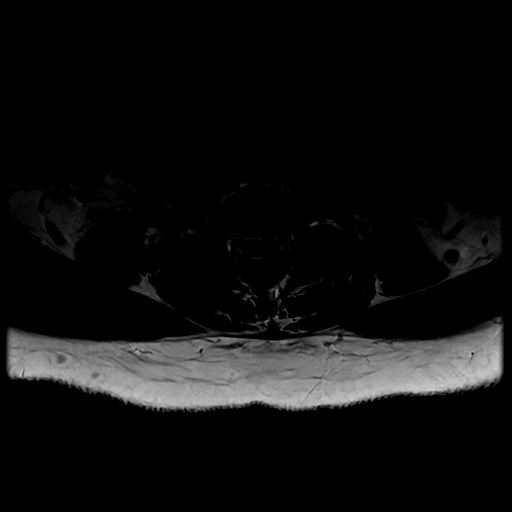
[im 11/26]
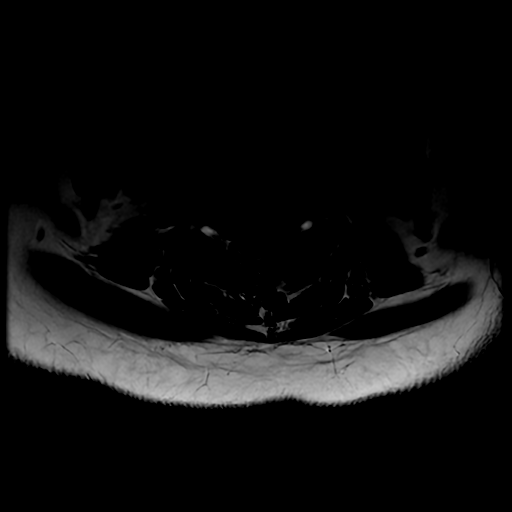
[im 15/26]
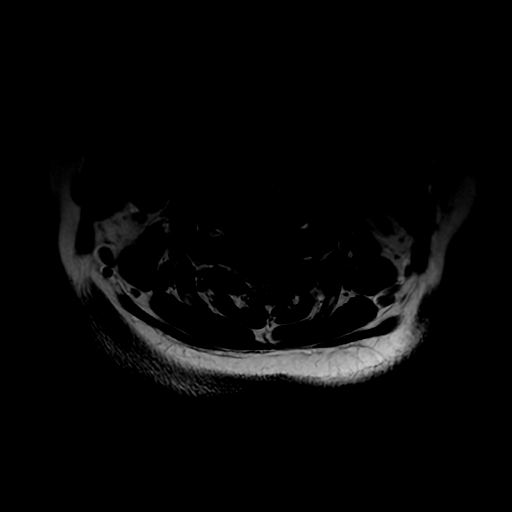
[im 18/26]
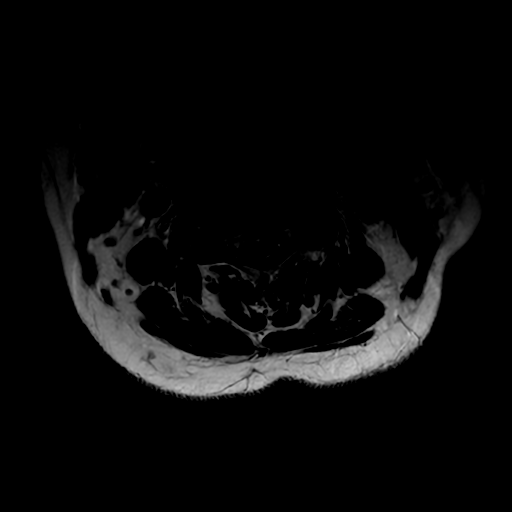
[im 22/26]
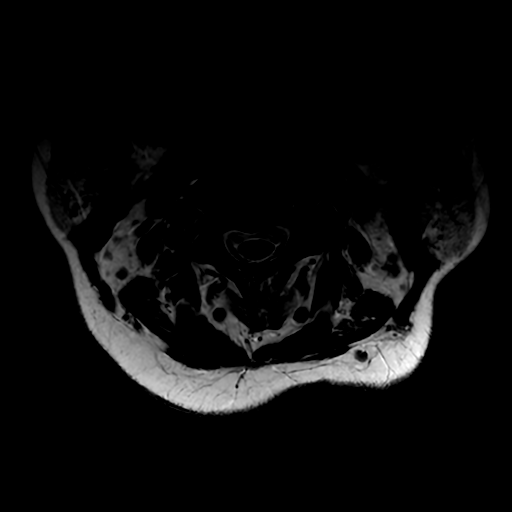

[19 of 48 positions shown; findings below may reference images not displayed]

FINDINGS: Alignment: Straightening of the normal cervical lordosis.

Vertebrae: No fracture or primary bone lesion.

Cord: No cord compression or primary cord lesion.

Posterior Fossa, vertebral arteries, paraspinal tissues: Negative

Disc levels:

Foramen magnum is widely patent. No significant finding at C1-2 or
C2-3.

C3-4: Minimal noncompressive disc bulge. Canal and foramina widely
patent.

C4-5: Endplate osteophytes and bulging of the disc. Minimal
narrowing with AP diameter in the midline measuring 9.5 mm.
Narrowing of the subarachnoid space but no compression of the cord.
Mild bony foraminal narrowing on the right.

C5-6: Spondylosis with endplate osteophytes and bulging of the disc.
Minimal narrowing of the AP diameter in the midline measuring
mm. No compressive effect upon the cord. Mild bony foraminal
narrowing bilaterally.

C6-7: Spondylosis with endplate osteophytes and bulging of the disc.
AP diameter of the canal in the midline 1 cm. The no compressive
canal or foraminal narrowing. Mild foraminal narrowing on the left.

C7-T1: Normal interspace.
IMPRESSION: No likely significant finding in the cervical region. Ordinary mild
degenerative spondylosis from C3-4 through C6-7. No compressive
canal stenosis. No primary cord pathology. Mild foraminal narrowing
on the right at C4-5, bilaterally at C5-6 and on the left at C6-7.

## 2020-01-27 NOTE — Progress Notes (Signed)
Carotis study completed.   See CVProc for preliminary results.   Jannet Askew, RDMS, RVT

## 2020-01-27 NOTE — Consult Note (Signed)
Stroke Neurology Consultation Note  Consult Requested by: Dr. Lorin Glass, [facility]  Reason for Consult: L hand numbness   Consult Date:  01/27/20  History of Present Illness:  Brenda Brock is an 64 y.o. black  female with possible right carpal tunnel syndrome presenting to [facility] with complaint on new onset L hand paresthesia, lower back pain, left leg tingling and visual disturbance.  Patient stated that Monday 3 PM she was working, started to have left hand tingling, initially in the fingers and then spread to palm, wrist and then forearm.  She thought it was her bra compressing on the nerve.  She went home, released the bra, but at night, she had lower back pain with left lateral leg and left foot tingling, lasted 2 hours and resolved.  Tuesday she had visual disturbance seeing patchy shadows in her visual field lasting less than 1 minute.  She also felt slight headache at that time.  She came to ER for evaluation.  In ER, she again had patchy shadows in her visual field lasting again less than 1 minute and resolved.  Currently, patient still complains of tingling sensation of left hand, including palm and all fingers.  Denies headache now.  Denies any weakness, speech difficulty.  Denies any history of a migraine, stroke, hypertension, diabetes, or hyperlipidemia.  She had right hand swollen 6 to 8 months ago, went to urgent care, put on wrist splint, and took ibuprofen, symptom resolved.  Was told to have right carpal tunnel syndrome.  Never saw neurologist or did any nerve conduction study.  She still has intermittent right hand spasm from time to time.  Date last known well: Date: 01/25/2020 Time last known well: Time: 18:05 tPA Given: No: Outside window, none disabling symptoms  History reviewed. No pertinent past medical history.   History reviewed. No pertinent surgical history.  No family history on file.   Social History:  reports that she has never  smoked. She has never used smokeless tobacco. She reports that she does not drink alcohol and does not use drugs.  Review of Systems: A full ROS was attempted today and was  able to be performed.  Systems assessed include - Constitutional, Eyes, HENT, Respiratory, Cardiovascular, Gastrointestinal, Genitourinary, Integument/breast, Hematologic/lymphatic, Musculoskeletal, Neurological, Behavioral/Psych, Endocrine, Allergic/Immunologic - with pertinent responses as per HPI.  Allergies: No Known Allergies   Medications: I have reviewed the patient's current medications.  Test Results: CBC: Recent Labs  Lab 01/26/20 0014 01/27/20 0253  WBC 8.2 7.8  NEUTROABS 3.1  --   HGB 9.6* 9.6*  HCT 31.4* 32.1*  MCV 65.1* 65.4*  PLT 391 391   Basic Metabolic Panel:  Recent Labs  Lab 01/26/20 0014 01/27/20 0253  NA 141 141  K 4.1 4.0  CL 106 104  CO2 26 26  GLUCOSE 111* 112*  BUN 16 17  CREATININE 1.13* 1.12*  CALCIUM 9.0 9.0   Urinalysis:  Recent Labs  Lab 01/26/20 0259  COLORURINE YELLOW  LABSPEC 1.025  PHURINE 6.0  GLUCOSEU NEGATIVE  HGBUR MODERATE*  BILIRUBINUR NEGATIVE  KETONESUR NEGATIVE  PROTEINUR NEGATIVE  NITRITE NEGATIVE  LEUKOCYTESUR NEGATIVE   Microbiology:  Results for orders placed or performed during the hospital encounter of 01/25/20  Respiratory Panel by RT PCR (Flu A&B, Covid) - Nasopharyngeal Swab     Status: None   Collection Time: 01/26/20  2:52 AM   Specimen: Nasopharyngeal Swab  Result Value Ref Range Status   SARS Coronavirus 2 by  RT PCR NEGATIVE NEGATIVE Final    Comment: (NOTE) SARS-CoV-2 target nucleic acids are NOT DETECTED.  The SARS-CoV-2 RNA is generally detectable in upper respiratoy specimens during the acute phase of infection. The lowest concentration of SARS-CoV-2 viral copies this assay can detect is 131 copies/mL. A negative result does not preclude SARS-Cov-2 infection and should not be used as the sole basis for treatment or other  patient management decisions. A negative result may occur with  improper specimen collection/handling, submission of specimen other than nasopharyngeal swab, presence of viral mutation(s) within the areas targeted by this assay, and inadequate number of viral copies (<131 copies/mL). A negative result must be combined with clinical observations, patient history, and epidemiological information. The expected result is Negative.  Fact Sheet for Patients:  https://www.moore.com/https://www.fda.gov/media/142436/download  Fact Sheet for Healthcare Providers:  https://www.young.biz/https://www.fda.gov/media/142435/download  This test is no t yet approved or cleared by the [place] FDA and  has been authorized for detection and/or diagnosis of SARS-CoV-2 by FDA under an Emergency Use Authorization (EUA). This EUA will remain  in effect (meaning this test can be used) for the duration of the COVID-19 declaration under Section 564(b)(1) of the Act, 21 U.S.C. section 360bbb-3(b)(1), unless the authorization is terminated or revoked sooner.     Influenza A by PCR NEGATIVE NEGATIVE Final   Influenza B by PCR NEGATIVE NEGATIVE Final    Comment: (NOTE) The Xpert Xpress SARS-CoV-2/FLU/RSV assay is intended as an aid in  the diagnosis of influenza from Nasopharyngeal swab specimens and  should not be used as a sole basis for treatment. Nasal washings and  aspirates are unacceptable for Xpert Xpress SARS-CoV-2/FLU/RSV  testing.  Fact Sheet for Patients: https://www.moore.com/https://www.fda.gov/media/142436/download  Fact Sheet for Healthcare Providers: https://www.young.biz/https://www.fda.gov/media/142435/download  This test is not yet approved or cleared by the [place] FDA and  has been authorized for detection and/or diagnosis of SARS-CoV-2 by  FDA under an Emergency Use Authorization (EUA). This EUA will remain  in effect (meaning this test can be used) for the duration of the  Covid-19 declaration under Section 564(b)(1) of the Act, 21  U.S.C. section  360bbb-3(b)(1), unless the authorization is  terminated or revoked. Performed at [facility] [place], [address] [place], [place] [id number]    Lipid Panel:     Component Value Date/Time   CHOL 154 01/27/2020 0253   TRIG 94 01/27/2020 0253   HDL 46 01/27/2020 0253   CHOLHDL 3.3 01/27/2020 0253   VLDL 19 01/27/2020 0253   LDLCALC 89 01/27/2020 0253   HgbA1c:  Lab Results  Component Value Date   HGBA1C 6.1 (H) 01/27/2020    DG Chest 2 View  Result Date: 01/26/2020 CLINICAL DATA:  Left hand tingling EXAM: CHEST - 2 VIEW COMPARISON:  None. FINDINGS: Cardiac shadow is within normal limits. Mild aortic calcifications are noted. No focal infiltrate or sizable effusion is seen. No bony abnormality is noted. Deviation of the trachea to the left is noted secondary to known thyroid goiter. No other focal abnormality is noted. IMPRESSION: Deviation of the trachea consistent with the known goiter. No acute abnormality noted. Electronically Signed   By: Alcide CleverMark  Lukens M.D.   On: 01/26/2020 22:14   CT Head Wo Contrast  Result Date: 01/25/2020 CLINICAL DATA:  Blurred vision, left leg tingling and pain, headache EXAM: CT HEAD WITHOUT CONTRAST TECHNIQUE: Contiguous axial images were obtained from the base of the skull through the vertex without intravenous contrast. COMPARISON:  None. FINDINGS: Brain: No acute  infarct or hemorrhage. Lateral ventricles and midline structures are unremarkable. No acute extra-axial fluid collections. No mass effect. Vascular: No hyperdense vessel or unexpected calcification. Skull: Normal. Negative for fracture or focal lesion. Sinuses/Orbits: No acute finding. Other: None. IMPRESSION: 1. No acute intracranial process. Electronically Signed   By: Sharlet Salina M.D.   On: 01/25/2020 23:24   MR ANGIO HEAD WO CONTRAST  Result Date: 01/26/2020 CLINICAL DATA:  TIA EXAM: MRI HEAD WITHOUT CONTRAST MRA HEAD WITHOUT CONTRAST TECHNIQUE: Multiplanar, multiecho pulse sequences  of the brain and surrounding structures were obtained without intravenous contrast. Angiographic images of the head were obtained using MRA technique without contrast. COMPARISON:  None. FINDINGS: MRI HEAD Brain: There is no acute infarction or intracranial hemorrhage. There is no intracranial mass, mass effect, or edema. There is no hydrocephalus or extra-axial fluid collection. Ventricles and sulci are normal in size and configuration. Patchy small foci of T2 hyperintensity in the supratentorial white matter are nonspecific but may reflect mild chronic microvascular ischemic changes. Vascular: Major vessel flow voids at the skull base are preserved. Skull and upper cervical spine: Normal marrow signal is preserved. Sinuses/Orbits: Minor mucosal thickening.  Orbits are unremarkable. Other: Sella is unremarkable.  Mastoid air cells are clear. MRA HEAD Intracranial internal carotid arteries are patent. Middle and anterior cerebral arteries are patent. Intracranial vertebral arteries, basilar artery, posterior cerebral arteries are patent. There is no significant stenosis or aneurysm. IMPRESSION: No acute infarction, hemorrhage, or mass. Mild chronic microvascular ischemic changes. No proximal intracranial vessel occlusion or significant stenosis. Electronically Signed   By: Guadlupe Spanish M.D.   On: 01/26/2020 21:32   MR BRAIN WO CONTRAST  Result Date: 01/26/2020 CLINICAL DATA:  TIA EXAM: MRI HEAD WITHOUT CONTRAST MRA HEAD WITHOUT CONTRAST TECHNIQUE: Multiplanar, multiecho pulse sequences of the brain and surrounding structures were obtained without intravenous contrast. Angiographic images of the head were obtained using MRA technique without contrast. COMPARISON:  None. FINDINGS: MRI HEAD Brain: There is no acute infarction or intracranial hemorrhage. There is no intracranial mass, mass effect, or edema. There is no hydrocephalus or extra-axial fluid collection. Ventricles and sulci are normal in size and  configuration. Patchy small foci of T2 hyperintensity in the supratentorial white matter are nonspecific but may reflect mild chronic microvascular ischemic changes. Vascular: Major vessel flow voids at the skull base are preserved. Skull and upper cervical spine: Normal marrow signal is preserved. Sinuses/Orbits: Minor mucosal thickening.  Orbits are unremarkable. Other: Sella is unremarkable.  Mastoid air cells are clear. MRA HEAD Intracranial internal carotid arteries are patent. Middle and anterior cerebral arteries are patent. Intracranial vertebral arteries, basilar artery, posterior cerebral arteries are patent. There is no significant stenosis or aneurysm. IMPRESSION: No acute infarction, hemorrhage, or mass. Mild chronic microvascular ischemic changes. No proximal intracranial vessel occlusion or significant stenosis. Electronically Signed   By: Guadlupe Spanish M.D.   On: 01/26/2020 21:32   VAS US CAROTID (at [facility] and [facility] only)  Result Date: 01/27/2020 Carotid Arterial Duplex Study Indications:  TIA, Visual disturbance, Syncope and Numbness. Risk Factors: None. Performing Technologist: Jannet Askew RCT RDMS  Examination Guidelines: A complete evaluation includes B-mode imaging, spectral Doppler, color Doppler, and power Doppler as needed of all accessible portions of each vessel. Bilateral testing is considered an integral part of a complete examination. Limited examinations for reoccurring indications may be performed as noted.  Right Carotid Findings: +----------+--------+--------+--------+------------------+------------------+           PSV cm/sEDV cm/sStenosisPlaque DescriptionComments           +----------+--------+--------+--------+------------------+------------------+  CCA Prox  117     29                                                   +----------+--------+--------+--------+------------------+------------------+ CCA Distal106     32                                intimal  thickening +----------+--------+--------+--------+------------------+------------------+ ICA Prox  147     41      1-39%                     intimal thickening +----------+--------+--------+--------+------------------+------------------+ ICA Distal141     46                                intimal thickening +----------+--------+--------+--------+------------------+------------------+ ECA       106     13                                                   +----------+--------+--------+--------+------------------+------------------+ +----------+--------+-------+--------+-------------------+           PSV cm/sEDV cmsDescribeArm Pressure (mmHG) +----------+--------+-------+--------+-------------------+ Subclavian240     4                                  +----------+--------+-------+--------+-------------------+ +---------+--------+--+--------+--+---------+ VertebralPSV cm/s75EDV cm/s22Antegrade +---------+--------+--+--------+--+---------+  Left Carotid Findings: +----------+--------+--------+--------+------------------+------------------+           PSV cm/sEDV cm/sStenosisPlaque DescriptionComments           +----------+--------+--------+--------+------------------+------------------+ CCA Prox  149     33                                                   +----------+--------+--------+--------+------------------+------------------+ CCA Distal113     30                                intimal thickening +----------+--------+--------+--------+------------------+------------------+ ICA Prox  122     40      1-39%                     intimal thickening +----------+--------+--------+--------+------------------+------------------+ ICA Distal142     51                                intimal thickening +----------+--------+--------+--------+------------------+------------------+ ECA       105     11                                                    +----------+--------+--------+--------+------------------+------------------+ +----------+--------+--------+--------+-------------------+           PSV cm/sEDV cm/sDescribeArm Pressure (mmHG) +----------+--------+--------+--------+-------------------+ Subclavian140  3                                   +----------+--------+--------+--------+-------------------+ +---------+--------+---+--------+--+---------+ VertebralPSV cm/s101EDV cm/s24Antegrade +---------+--------+---+--------+--+---------+   Summary: Right Carotid: Velocities in the right ICA are consistent with a 1-39% stenosis.                The ECA appears <50% stenosed. Left Carotid: Velocities in the left ICA are consistent with a 1-39% stenosis.               The ECA appears <50% stenosed. Vertebrals: Bilateral vertebral arteries demonstrate antegrade flow. *See table(s) above for measurements and observations.     Preliminary      EKG: normal EKG, normal sinus rhythm.   Physical Examination: Temp:  [97.7 F (36.5 C)-98.4 F (36.9 C)] 97.8 F (36.6 C) (11/03 0730) Pulse Rate:  [70-90] 75 (11/03 0730) Resp:  [18-20] 18 (11/03 0730) BP: (110-163)/(54-71) 149/71 (11/03 0730) SpO2:  [96 %-100 %] 99 % (11/03 0730)  General - Well nourished, well developed, in no apparent distress.  Ophthalmologic - fundi not visualized due to noncooperation.  Cardiovascular - Regular rate and rhythm.  Mental Status -  Level of arousal and orientation to time, place, and person were intact. Language including expression, naming, repetition, comprehension was assessed and found intact. Attention span and concentration were normal. Fund of Knowledge was assessed and was intact.  Cranial Nerves II - XII - II - Visual field intact OU. III, IV, VI - Extraocular movements intact. V - Facial sensation intact bilaterally. VII - Facial movement intact bilaterally. VIII - Hearing & vestibular intact bilaterally. X - Palate elevates  symmetrically. XI - Chin turning & shoulder shrug intact bilaterally. XII - Tongue protrusion intact.  Motor Strength - The patient's strength was normal in all extremities and pronator drift was absent.  Bulk was normal and fasciculations were absent.   Motor Tone - Muscle tone was assessed at the neck and appendages and was normal.  Reflexes - The patient's reflexes were symmetrical in all extremities and she had no pathological reflexes.  Sensory - Light touch, temperature/pinprick were assessed and were symmetrical.  Subjective tingling sensation at left palm and all fingers.  Left Tinel and phalen sign mildly positive.   Coordination - The patient had normal movements in the hands and feet with no ataxia or dysmetria.  Tremor was absent.  Gait and Station - deferred.   Assessment:  Brenda Brock is a 64 y.o. female with no sign past medical history  presenting with L hand paresthesias and transient double vision.  ? Mild L Carpal Tunnel Syndrome  CT head No acute abnormality  MRI  No acute abnormality  MRA  No significant occlusion or stenosis   MRI C-spine no significant finding in the cervical region  Carotid Doppler  B ICA 1-39% stenosis, VAs antegrade   2D Echo EF 65-70%. No source of embolus    LDL 89  HgbA1c 6.1   No antithrombotic prior to admission,  No antithrombotic needed at d/c  OP eval at [facility] w/ Dr Lucia Gaskins or Dr Terrace Arabia for EMG/NCS - requested  Lumbar sacral radiculopathy  Transient lower back pain  Transient left lower extremity paresthesia  OP evaluation at [facility] with Dr. Lucia Gaskins or Dr. Terrace Arabia for EMG/NCS  ? Migraine with visual aura  2 episode of brief visual disturbance with seeing patchy shadows  Associated with  mild headache  No history of migraine  Follow-up with [facility] Dr. Lucia Gaskins or Dr. Terrace Arabia.  Stroke Risk Factors  Obesity, Body mass index is 33.15 kg/m., recommend weight loss, diet and exercise as appropriate   Other Active Problems  Mild  AKI  Hospital day # 1   Thank you for this consultation and allowing [place] to participate in the care of this patient. Neurology will sign off. Please call with questions. Pt will follow up with Dr. Lucia Gaskins or Dr. Terrace Arabia at [facility] in about 4 weeks. Thanks for the consult.  Marvel Plan, MD PhD Stroke Neurology 01/27/2020 6:22 PM    To contact Stroke Continuity provider, please refer to [email or web address]. After hours, contact General Neurology

## 2020-01-27 NOTE — Evaluation (Signed)
Physical Therapy Evaluation Patient Details Name: Brenda Brock MRN: [id number] DOB: [birth year] Today's Date: 01/27/2020   History of Present Illness  Pt is a 64 year old female who presented with tingling in her L UE and LE, headache, L lower back pain, and vision changes. CT and MRI of head were negative for any acute changes. MRI revealed mild chronic microvascular ischemic changes. Suspected TIA. She has a history of R carpal tunnel syndrome. NIH changed from 0 on 01/25/20 to 1.  Clinical Impression  Pt appears to be at baseline independent level of function as she is able to perform all functional mobility in appropriate time frames and safely. She did not display any LOB or strength, sensation, or coordination deficits in her LEs. She did report some numbness in her L hand though still. She was able to maintain SLS without UE support for > 10 seconds on each LE without LOB, suggesting appropriate balance for stair negotiation in prep for d/c home. Unable to assess formal stair negotiation as pt was transferred to treatment towards end of session. Due to her being at baseline and independent with all functional mobility no further PT services are deemed necessary at this time.     Follow Up Recommendations No PT follow up    Equipment Recommendations  None recommended by PT    Recommendations for Other Services       Precautions / Restrictions Precautions Precautions: Fall Restrictions Weight Bearing Restrictions: No      Mobility  Bed Mobility Overal bed mobility: Independent             General bed mobility comments: Able to roll and transition to sit in bed with bed flat without utilization of bed rails in appropriate time frame and safely.    Transfers Overall transfer level: Independent Equipment used: None             General transfer comment: STS from EOB in appropriate amount of time without unsteadiness or LOB.  Ambulation/Gait Ambulation/Gait assistance:  Independent Gait Distance (Feet): 30 Feet Assistive device: None Gait Pattern/deviations: WFL(Within Functional Limits) Gait velocity: WNL Gait velocity interpretation: >4.37 ft/sec, indicative of normal walking speed General Gait Details: WNL  Stairs            Wheelchair Mobility    Modified Rankin (Stroke Patients Only) Modified Rankin (Stroke Patients Only) Pre-Morbid Rankin Score: No symptoms Modified Rankin: No significant disability     Balance Overall balance assessment: Independent (Maintains SLS for >10 seconds B LE without UE support or LOB)                                           Pertinent Vitals/Pain Pain Assessment: No/denies pain    Home Living Family/patient expects to be discharged to:: Private residence Living Arrangements: Spouse/significant other;Other relatives (grandsons (58 and 60 years old)) Available Help at Discharge: Family;Available PRN/intermittently (huasband works, grandsons attend school) Type of Home: House Home Access: Stairs to enter (1 in garage, 6 steps in front of house) Entrance Stairs-Rails: None (in garage, B in front but unable to reach both at same time) [employer] of Steps: 1 (in garage, 6 in front with B handrials (reach 1 at a time)) Home Layout: Multi-level;Bed/bath upstairs (2 story and basement with bedroom and mini-kitchen) Home Equipment: None      Prior Function Level of Independence: Independent  Comments: Pt works as a Merchandiser, 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      Hand Dominance   Dominant Hand: Right    Extremity/Trunk Assessment   Upper Extremity Assessment Upper Extremity Assessment: Defer to OT evaluation    Lower Extremity Assessment Lower Extremity Assessment: Overall WFL for tasks assessed    Cervical / Trunk Assessment Cervical / Trunk Assessment: Normal  Communication   Communication: No difficulties  Cognition Arousal/Alertness:  Awake/alert Behavior During Therapy: WFL for tasks assessed/performed Overall Cognitive Status: Within Functional Limits for tasks assessed                                 General Comments: A&Ox4      General Comments      Exercises     Assessment/Plan    PT Assessment Patent does not need any further PT services  PT Problem List         PT Treatment Interventions      PT Goals (Current goals can be found in the Care Plan section)  Acute Rehab PT Goals Patient Stated Goal: to go home and care for her children and grandchildren PT Goal Formulation: With patient Time For Goal Achievement: 01/28/20 Potential to Achieve Goals: Good    Frequency     Barriers to discharge        Co-evaluation               AM-PAC PT "6 Clicks" Mobility  Outcome Measure Help needed turning from your back to your side while in a flat bed without using bedrails?: None Help needed moving from lying on your back to sitting on the side of a flat bed without using bedrails?: None Help needed moving to and from a bed to a chair (including a wheelchair)?: None Help needed standing up from a chair using your arms (e.g., wheelchair or bedside chair)?: None Help needed to walk in hospital room?: None Help needed climbing 3-5 steps with a railing? : None 6 Click Score: 24    End of Session Equipment Utilized During Treatment: Gait belt Activity Tolerance: Patient tolerated treatment well Patient left: in bed;with call bell/phone within reach;with nursing/sitter in room (being taken to treatment) Nurse Communication: Mobility status PT Visit Diagnosis: Other symptoms and signs involving the nervous system (S28.315)    Time: 1761-6073 PT Time Calculation (min) (ACUTE ONLY): 20 min   Charges:   PT Evaluation $PT Eval Low Complexity: 1 Low          Raymond Gurney, PT, DPT Acute Rehabilitation Services  Pager: [phone] Office: [phone]   Brenda Brock  01/27/2020, 8:50 AM

## 2020-01-27 NOTE — Discharge Summary (Signed)
Physician Discharge Summary  Brenda Brock JJK:[id number] DOB: 04-19-1955 DOA: 01/25/2020  PCP: [place], Betty G, MD  Admit date: 01/25/2020 Discharge date: 01/27/2020  Admitted From: Home Discharge disposition: Home   Code Status: Full Code  Diet Recommendation: Regular diet  Discharge Diagnosis:   Principal Problem:   Left sided numbness   History of Present Illness / Brief narrative:  Brenda Brock  is a 64 y.o. female, with history of carpal tunnel syndrome involving right hand, and no other significant medical problems.   Patient presented to the ED on 11/1 with chief complaint of paresthesia in her left hand which started 24-hour prior to presentation. Patient states that she thought it was tight fitting bra strap which was causing this numbness she removed her bra and did not relieve the symptoms.  Patient also developed mild paresthesia in left leg with some backache.  She was also having episode of double vision while she looked at the wall clock.  The symptoms lasted only for few seconds.   Patient came to ED for further evaluation.  She denies slurred speech.  No other weakness of extremities.  In the ED CT head was unremarkable.  She has no previous history of stroke or seizures. Denies chest pain or shortness of breath. No nausea vomiting or diarrhea No history of CAD  She was kept on observation for TIA evaluation.  Subjective:  Seen and examined this morning.  Pleasant middle-aged African-American female.  Sitting up at the edge of the bed.  Not in distress. Continues to have numbness in the left hand.  Daughter at bedside.  Hospital Course:  Left-sided numbness -Hospitalized for TIA work-up.  TIA ruled out. -CT head unremarkable.  MRI brain unremarkable. -Echocardiogram with EF 65 to 70%.  No intracardiac source of embolism was detected. -Carotid ultrasound with less than 50% stenosis. -Lipid panel with HDL 46, LDL 89, A1c 6.1 -Seen by neurology.  MRI cervical  spine was also obtained.  It did not show any significant finding in the cervical region.  There is mild degenerative spondylosis from C3-C7 without compressive canal stenosis. -Patient's numbness is likely related to carpal tunnel syndrome.  She has history of carpal tunnel syndrome on the right side.  She will follow up with neurology as an outpatient. -No need of antiplatelet per neurology.  Mild AKI-likely dehydration.  Patient's creatinine was 1.13, up from 0.81 in 2017.    Improved with gentle hydration.    Stable discharge to home today.   Wound care:    Discharge Exam:   Vitals:   01/27/20 0434 01/27/20 0730 01/27/20 1147 01/27/20 1606  BP: 123/65 (!) 149/71 (!) 125/59 (!) 126/57  Pulse: 70 75 78 73  Resp: 18 18 18 18   Temp: 97.7 F (36.5 C) 97.8 F (36.6 C) 98.7 F (37.1 C) 97.7 F (36.5 C)  TempSrc: Oral Oral Oral Oral  SpO2: 99% 99% 98% 99%  Weight:      Height:        Body mass index is 33.15 kg/m.  General exam: Appears calm and comfortable.  Not in physical distress Skin: No rashes, lesions or ulcers. HEENT: Atraumatic, normocephalic, supple neck, no obvious bleeding Lungs: Clear to auscultation bilaterally CVS: Regular rate and rhythm, no murmur GI/Abd soft, nontender, nondistended, bowel sound present CNS: Alert, awake, oriented x3 Psychiatry: Mood appropriate Extremities: No pedal edema, no calf tenderness  Follow ups:   Discharge Instructions    Diet - low sodium heart healthy   Complete  by: As directed    Increase activity slowly   Complete by: As directed       Follow-up Information    [place], Betty G, MD Follow up.   Specialty: Family Medicine Contact information: [address] [place] [address] [phone]               Recommendations for Outpatient Follow-Up:   1. Follow-up with PCP as an outpatient 2. Follow-up with neurology as an outpatient  Discharge Instructions:  Follow with Primary MD [place], Betty  G, MD in 7 days   Get CBC/BMP checked in next visit within 1 week by PCP or SNF MD ( we routinely change or add medications that can affect your baseline labs and fluid status, therefore we recommend that you get the mentioned basic workup next visit with your PCP, your PCP may decide not to get them or add new tests based on their clinical decision)  On your next visit with your PCP, please Get Medicines reviewed and adjusted.  Please request your PCP  to go over all Hospital Tests and Procedure/Radiological results at the follow up, please get all Hospital records sent to your Prim MD by signing hospital release before you go home.  Activity: As tolerated with Full fall precautions use walker/cane & assistance as needed  For Heart failure patients - Check your Weight same time everyday, if you gain over 2 pounds, or you develop in leg swelling, experience more shortness of breath or chest pain, call your Primary MD immediately. Follow Cardiac Low Salt Diet and 1.5 lit/day fluid restriction.  If you have smoked or chewed Tobacco in the last 2 yrs please stop smoking, stop any regular Alcohol  and or any Recreational drug use.  If you experience worsening of your admission symptoms, develop shortness of breath, life threatening emergency, suicidal or homicidal thoughts you must seek medical attention immediately by calling 911 or calling your MD immediately  if symptoms less severe.  You Must read complete instructions/literature along with all the possible adverse reactions/side effects for all the Medicines you take and that have been prescribed to you. Take any new Medicines after you have completely understood and accpet all the possible adverse reactions/side effects.   Do not drive, operate heavy machinery, perform activities at heights, swimming or participation in water activities or provide baby sitting services if your were admitted for syncope or siezures until you have seen by Primary  MD or a Neurologist and advised to do so again.  Do not drive when taking Pain medications.  Do not take more than prescribed Pain, Sleep and Anxiety Medications  Wear Seat belts while driving.   Please note You were cared for by a hospitalist during your hospital stay. If you have any questions about your discharge medications or the care you received while you were in the hospital after you are discharged, you can call the unit and asked to speak with the hospitalist on call if the hospitalist that took care of you is not available. Once you are discharged, your primary care physician will handle any further medical issues. Please note that NO REFILLS for any discharge medications will be authorized once you are discharged, as it is imperative that you return to your primary care physician (or establish a relationship with a primary care physician if you do not have one) for your aftercare needs so that they can reassess your need for medications and monitor your lab values.  Allergies as of 01/27/2020   No Known Allergies     Medication List    STOP taking these medications   cephALEXin 500 MG capsule Commonly known as: KEFLEX   HYDROcodone-acetaminophen 5-325 MG tablet Commonly known as: NORCO/VICODIN   predniSONE 50 MG tablet Commonly known as: DELTASONE     TAKE these medications   acetaminophen 500 MG tablet Commonly known as: TYLENOL Take 500-1,000 mg by mouth every 8 (eight) hours as needed for mild pain (or headaches).   Adult One Daily Gummies Chew Chew 1 tablet by mouth daily.   CALCIUM+D3 PO Take 1 tablet by mouth daily.   ferrous sulfate 325 (65 FE) MG tablet Take 325 mg by mouth daily with breakfast.   naproxen sodium 220 MG tablet Commonly known as: ALEVE Take 220-440 mg by mouth 2 (two) times daily as needed (for headaches or inflammation).       Time coordinating discharge: 35 minutes  The results of significant diagnostics from this  hospitalization (including imaging, microbiology, ancillary and laboratory) are listed below for reference.    Procedures and Diagnostic Studies:   DG Chest 2 View  Result Date: 01/26/2020 CLINICAL DATA:  Left hand tingling EXAM: CHEST - 2 VIEW COMPARISON:  None. FINDINGS: Cardiac shadow is within normal limits. Mild aortic calcifications are noted. No focal infiltrate or sizable effusion is seen. No bony abnormality is noted. Deviation of the trachea to the left is noted secondary to known thyroid goiter. No other focal abnormality is noted. IMPRESSION: Deviation of the trachea consistent with the known goiter. No acute abnormality noted. Electronically Signed   By: Alcide Clever M.D.   On: 01/26/2020 22:14   CT Head Wo Contrast  Result Date: 01/25/2020 CLINICAL DATA:  Blurred vision, left leg tingling and pain, headache EXAM: CT HEAD WITHOUT CONTRAST TECHNIQUE: Contiguous axial images were obtained from the base of the skull through the vertex without intravenous contrast. COMPARISON:  None. FINDINGS: Brain: No acute infarct or hemorrhage. Lateral ventricles and midline structures are unremarkable. No acute extra-axial fluid collections. No mass effect. Vascular: No hyperdense vessel or unexpected calcification. Skull: Normal. Negative for fracture or focal lesion. Sinuses/Orbits: No acute finding. Other: None. IMPRESSION: 1. No acute intracranial process. Electronically Signed   By: Sharlet Salina M.D.   On: 01/25/2020 23:24   MR ANGIO HEAD WO CONTRAST  Result Date: 01/26/2020 CLINICAL DATA:  TIA EXAM: MRI HEAD WITHOUT CONTRAST MRA HEAD WITHOUT CONTRAST TECHNIQUE: Multiplanar, multiecho pulse sequences of the brain and surrounding structures were obtained without intravenous contrast. Angiographic images of the head were obtained using MRA technique without contrast. COMPARISON:  None. FINDINGS: MRI HEAD Brain: There is no acute infarction or intracranial hemorrhage. There is no intracranial mass,  mass effect, or edema. There is no hydrocephalus or extra-axial fluid collection. Ventricles and sulci are normal in size and configuration. Patchy small foci of T2 hyperintensity in the supratentorial white matter are nonspecific but may reflect mild chronic microvascular ischemic changes. Vascular: Major vessel flow voids at the skull base are preserved. Skull and upper cervical spine: Normal marrow signal is preserved. Sinuses/Orbits: Minor mucosal thickening.  Orbits are unremarkable. Other: Sella is unremarkable.  Mastoid air cells are clear. MRA HEAD Intracranial internal carotid arteries are patent. Middle and anterior cerebral arteries are patent. Intracranial vertebral arteries, basilar artery, posterior cerebral arteries are patent. There is no significant stenosis or aneurysm. IMPRESSION: No acute infarction, hemorrhage, or mass. Mild chronic microvascular ischemic changes. No proximal intracranial vessel occlusion  or significant stenosis. Electronically Signed   By: Guadlupe Spanish M.D.   On: 01/26/2020 21:32   MR BRAIN WO CONTRAST  Result Date: 01/26/2020 CLINICAL DATA:  TIA EXAM: MRI HEAD WITHOUT CONTRAST MRA HEAD WITHOUT CONTRAST TECHNIQUE: Multiplanar, multiecho pulse sequences of the brain and surrounding structures were obtained without intravenous contrast. Angiographic images of the head were obtained using MRA technique without contrast. COMPARISON:  None. FINDINGS: MRI HEAD Brain: There is no acute infarction or intracranial hemorrhage. There is no intracranial mass, mass effect, or edema. There is no hydrocephalus or extra-axial fluid collection. Ventricles and sulci are normal in size and configuration. Patchy small foci of T2 hyperintensity in the supratentorial white matter are nonspecific but may reflect mild chronic microvascular ischemic changes. Vascular: Major vessel flow voids at the skull base are preserved. Skull and upper cervical spine: Normal marrow signal is preserved.  Sinuses/Orbits: Minor mucosal thickening.  Orbits are unremarkable. Other: Sella is unremarkable.  Mastoid air cells are clear. MRA HEAD Intracranial internal carotid arteries are patent. Middle and anterior cerebral arteries are patent. Intracranial vertebral arteries, basilar artery, posterior cerebral arteries are patent. There is no significant stenosis or aneurysm. IMPRESSION: No acute infarction, hemorrhage, or mass. Mild chronic microvascular ischemic changes. No proximal intracranial vessel occlusion or significant stenosis. Electronically Signed   By: Guadlupe Spanish M.D.   On: 01/26/2020 21:32     Labs:   Basic Metabolic Panel: Recent Labs  Lab 01/26/20 0014 01/27/20 0253  NA 141 141  K 4.1 4.0  CL 106 104  CO2 26 26  GLUCOSE 111* 112*  BUN 16 17  CREATININE 1.13* 1.12*  CALCIUM 9.0 9.0   GFR Estimated Creatinine Clearance: 60.4 mL/min (A) (by C-G formula based on SCr of 1.12 mg/dL (H)). Liver Function Tests: No results for input(s): AST, ALT, ALKPHOS, BILITOT, PROT, ALBUMIN in the last 168 hours. No results for input(s): LIPASE, AMYLASE in the last 168 hours. No results for input(s): AMMONIA in the last 168 hours. Coagulation profile No results for input(s): INR, PROTIME in the last 168 hours.  CBC: Recent Labs  Lab 01/26/20 0014 01/27/20 0253  WBC 8.2 7.8  NEUTROABS 3.1  --   HGB 9.6* 9.6*  HCT 31.4* 32.1*  MCV 65.1* 65.4*  PLT 391 391   Cardiac Enzymes: No results for input(s): CKTOTAL, CKMB, CKMBINDEX, TROPONINI in the last 168 hours. BNP: Invalid input(s): POCBNP CBG: No results for input(s): GLUCAP in the last 168 hours. D-Dimer No results for input(s): DDIMER in the last 72 hours. Hgb A1c Recent Labs    01/27/20 0253  HGBA1C 6.1*   Lipid Profile Recent Labs    01/27/20 0253  CHOL 154  HDL 46  LDLCALC 89  TRIG 94  CHOLHDL 3.3   Thyroid function studies No results for input(s): TSH, T4TOTAL, T3FREE, THYROIDAB in the last 72  hours.  Invalid input(s): FREET3 Anemia work up No results for input(s): VITAMINB12, FOLATE, FERRITIN, TIBC, IRON, RETICCTPCT in the last 72 hours. Microbiology Recent Results (from the past 240 hour(s))  Respiratory Panel by RT PCR (Flu A&B, Covid) - Nasopharyngeal Swab     Status: None   Collection Time: 01/26/20  2:52 AM   Specimen: Nasopharyngeal Swab  Result Value Ref Range Status   SARS Coronavirus 2 by RT PCR NEGATIVE NEGATIVE Final    Comment: (NOTE) SARS-CoV-2 target nucleic acids are NOT DETECTED.  The SARS-CoV-2 RNA is generally detectable in upper respiratoy specimens during the acute phase of infection.  The lowest concentration of SARS-CoV-2 viral copies this assay can detect is 131 copies/mL. A negative result does not preclude SARS-Cov-2 infection and should not be used as the sole basis for treatment or other patient management decisions. A negative result may occur with  improper specimen collection/handling, submission of specimen other than nasopharyngeal swab, presence of viral mutation(s) within the areas targeted by this assay, and inadequate number of viral copies (<131 copies/mL). A negative result must be combined with clinical observations, patient history, and epidemiological information. The expected result is Negative.  Fact Sheet for Patients:  https://www.moore.com/  Fact Sheet for Healthcare Providers:  https://www.young.biz/  This test is no t yet approved or cleared by the [place] FDA and  has been authorized for detection and/or diagnosis of SARS-CoV-2 by FDA under an Emergency Use Authorization (EUA). This EUA will remain  in effect (meaning this test can be used) for the duration of the COVID-19 declaration under Section 564(b)(1) of the Act, 21 U.S.C. section 360bbb-3(b)(1), unless the authorization is terminated or revoked sooner.     Influenza A by PCR NEGATIVE NEGATIVE Final   Influenza B  by PCR NEGATIVE NEGATIVE Final    Comment: (NOTE) The Xpert Xpress SARS-CoV-2/FLU/RSV assay is intended as an aid in  the diagnosis of influenza from Nasopharyngeal swab specimens and  should not be used as a sole basis for treatment. Nasal washings and  aspirates are unacceptable for Xpert Xpress SARS-CoV-2/FLU/RSV  testing.  Fact Sheet for Patients: https://www.moore.com/  Fact Sheet for Healthcare Providers: https://www.young.biz/  This test is not yet approved or cleared by the [place] FDA and  has been authorized for detection and/or diagnosis of SARS-CoV-2 by  FDA under an Emergency Use Authorization (EUA). This EUA will remain  in effect (meaning this test can be used) for the duration of the  Covid-19 declaration under Section 564(b)(1) of the Act, 21  U.S.C. section 360bbb-3(b)(1), unless the authorization is  terminated or revoked. Performed at [facility], [address] [place], [place] [address]      Signed: Lorin Glass  [facility] 01/27/2020, 5:21 PM

## 2020-01-27 NOTE — Progress Notes (Signed)
Pt discharged home.  Discharge instructions explained, pt verbalizes understanding. 

## 2020-01-27 NOTE — Progress Notes (Signed)
  Echocardiogram 2D Echocardiogram has been performed.  Brenda Brock 01/27/2020, 9:17 AM

## 2020-01-27 NOTE — Evaluation (Signed)
Occupational Therapy Evaluation Patient Details Name: Brenda Brock MRN: [id number] DOB: [birth year] Today's Date: 01/27/2020    History of Present Illness Pt is a 64 year old female who presented with tingling in her L UE and LE, headache, L lower back pain, and vision changes. CT and MRI of head were negative for any acute changes. MRI revealed mild chronic microvascular ischemic changes. Suspected TIA. She has a history of R carpal tunnel syndrome. NIH changed from 0 on 01/25/20 to 1.   Clinical Impression   Pt pleasant and agreeable to therapy session, daughter present for session. noa cute focal deficits observed with only persistent reports of numbness/tingling to digits 3-5 with + testing for carpal tunnel noted during session. Pt confirms MD endorses possibility of carpal tunnel. Att his time, pt with no Lob, demo's ability to complete all self care transfers and tasks in both standing and sitting at indep level of performance with good safety and insight. 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 No acute or post acute OT indicated at this time, with OT to sign off.    Follow Up Recommendations  No OT follow up    Equipment Recommendations  None recommended by OT    Recommendations for Other Services       Precautions / Restrictions Precautions Precautions: None Restrictions Weight Bearing Restrictions: No      Mobility Bed Mobility Overal bed mobility: Independent             General bed mobility comments: HOB flat, no reliance on rails    Transfers Overall transfer level: Independent Equipment used: None             General transfer comment: STS from EOB in appropriate amount of time without unsteadiness or LOB.    Balance Overall balance assessment: Independent      ADL either performed or assessed with  clinical judgement   ADL Overall ADL's : Independent   General ADL Comments: no acute deficits with completion of ADL's during assessment    Vision Baseline Vision/History: No visual deficits Patient Visual Report: No change from baseline Vision Assessment?: No apparent visual deficits     Perception Perception Perception Tested?: No   Praxis      Pertinent Vitals/Pain Pain Assessment: No/denies pain     Hand Dominance Right   Extremity/Trunk Assessment Upper Extremity Assessment Upper Extremity Assessment: Overall WFL for tasks assessed   Lower Extremity Assessment Lower Extremity Assessment: Defer to PT evaluation   Cervical / Trunk Assessment Cervical / Trunk Assessment: Normal   Communication Communication Communication: No difficulties   Cognition Arousal/Alertness: Awake/alert Behavior During Therapy: WFL for tasks assessed/performed Overall Cognitive Status: Within Functional Limits for tasks assessed                                 General Comments: A&Ox4   General Comments       Exercises     Shoulder Instructions      Home Living Family/patient expects to be discharged to:: Private residence Living Arrangements: Spouse/significant other;Other relatives Available Help at Discharge: Family;Available PRN/intermittently Type of Home: House (3 level home) Home Access: Stairs to enter [employer] of Steps: 1 Entrance Stairs-Rails: None Home Layout: Multi-level;Bed/bath upstairs Alternate Level Stairs-Number of Steps: 16 Alternate Level Stairs-Rails: Left Bathroom Shower/Tub: Walk-in  shower   Bathroom Toilet: Standard Bathroom Accessibility: Yes   Home Equipment: None   Additional Comments: 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         OT  Problem List: Impaired sensation      OT Treatment/Interventions:      OT Goals(Current goals can be found in the care plan section) Acute Rehab OT Goals Patient Stated Goal: to go home today  OT Frequency:     Barriers to D/C:            Co-evaluation              AM-PAC OT "6 Clicks" Daily Activity     Outcome Measure Help from another person eating meals?: None Help from another person taking care of personal grooming?: None Help from another person toileting, which includes using toliet, bedpan, or urinal?: None Help from another person bathing (including washing, rinsing, drying)?: None Help from another person to put on and taking off regular upper body clothing?: None Help from another person to put on and taking off regular lower body clothing?: None 6 Click Score: 24   End of Session    Activity Tolerance: Patient tolerated treatment well Patient left: in bed;with call bell/phone within reach;with family/visitor present  OT Visit Diagnosis: Other (comment)                Time: 1308-6578 OT Time Calculation (min): 28 min Charges:  OT General Charges $OT Visit: 1 Visit OT Evaluation $OT Eval Low Complexity: 1 Low OT Treatments $Therapeutic Activity: 8-22 mins  Hawken Bielby OTR/L acute rehab services Office: [phone]  [place] [employer] Ej Pinson 01/27/2020, 10:29 AM

## 2020-02-01 DIAGNOSIS — G459 Transient cerebral ischemic attack, unspecified: Secondary | ICD-10-CM | POA: Diagnosis present

## 2020-02-04 DIAGNOSIS — Z23 Encounter for immunization: Secondary | ICD-10-CM | POA: Diagnosis not present

## 2020-02-11 DIAGNOSIS — Z13220 Encounter for screening for lipoid disorders: Secondary | ICD-10-CM | POA: Diagnosis not present

## 2020-02-11 DIAGNOSIS — Z23 Encounter for immunization: Secondary | ICD-10-CM | POA: Diagnosis not present

## 2020-02-11 DIAGNOSIS — Z0000 Encounter for general adult medical examination without abnormal findings: Secondary | ICD-10-CM | POA: Diagnosis not present

## 2020-02-11 DIAGNOSIS — R7989 Other specified abnormal findings of blood chemistry: Secondary | ICD-10-CM | POA: Diagnosis not present

## 2020-02-17 ENCOUNTER — Other Ambulatory Visit: Payer: Self-pay | Admitting: Family Medicine

## 2020-02-17 DIAGNOSIS — Z1231 Encounter for screening mammogram for malignant neoplasm of breast: Secondary | ICD-10-CM

## 2020-04-05 ENCOUNTER — Ambulatory Visit: Payer: Federal, State, Local not specified - PPO

## 2020-04-12 ENCOUNTER — Ambulatory Visit: Payer: Self-pay | Admitting: Neurology

## 2020-05-16 ENCOUNTER — Ambulatory Visit: Payer: Federal, State, Local not specified - PPO

## 2020-06-02 ENCOUNTER — Encounter: Payer: Self-pay | Admitting: Neurology

## 2020-06-02 ENCOUNTER — Ambulatory Visit: Payer: Federal, State, Local not specified - PPO | Admitting: Neurology

## 2020-06-02 VITALS — BP 139/74 | HR 85 | Ht 67.0 in | Wt 217.0 lb

## 2020-06-02 DIAGNOSIS — R200 Anesthesia of skin: Secondary | ICD-10-CM | POA: Diagnosis not present

## 2020-06-02 DIAGNOSIS — R202 Paresthesia of skin: Secondary | ICD-10-CM

## 2020-06-02 NOTE — Progress Notes (Signed)
[facility]    Provider:  Dr Lucia Gaskins Requesting Provider: Layne Benton, NP Primary Care Provider:  Sheliah Hatch, PA-C  CC:  Left hand tingling  HPI:  Brenda Brock is a 65 y.o. female here as requested by Layne Benton, NP for paresthesias.  I reviewed admission notes from inpatient stay early November 2021, she presented to [facility] with a complaint of new onset left hand paresthesias, lower back pain, left leg tingling and visual disturbances, started to have left hand tingling initially in the fingers and then spread to palm wrist and then forearm.  Later that night she had lower back pain with left lateral leg and foot tingling which lasted a few hours and resolved.  The next morning she says she had visual disturbances seeing patchy shadows in her visual field lasting less than 1 minute.  She also felt slight headache at that time.  When patient was seen by the stroke team, she still complained of tingling sensation of the left hand.  Otherwise her symptoms had all resolved.  She does have a diagnosed history of carpal tunnel syndrome.  I reviewed neurology exam, a detailed neurologic examination was normal except for some subjective tingling sensation at the left palm in all the fingers; left Tinel and Phalen sign at the wrist positive.  Work-up included CT of the head, MRI of the brain, MRA of the head, carotid Dopplers, echocardiogram, LDL (89), hemoglobin A1c (6.1).  All workup unremarkable. Patient being seen today for outpatient evaluation for EMG nerve conduction studies.  She still has tingling in the left hand. More digits 4-5 especially digit 5.  Every other symptom resolved. No more vision changes, legs are fine. No pain. It is just on the left. She has had it since October. It is constant, doesn't go away, Can be worse in the morning. No weaknessin the hand. The same. Even the 5th digit is involved. No insiting events. Continuous. Mild. It doesn't  hurt. She has neck pain and radicular symptoms. She ahs pain on the left side of the neck. Radiates down the arm to the 4th and 5th digits. No other focal neurologic deficits, associated symptoms, inciting events or modifiable factors.Denies any symptoms in the legs or right hand.  Reviewed notes, labs and imaging from outside physicians, which showed:  MRI brain and mra head: 01/26/2020: reviewed reports IMPRESSION: No acute infarction, hemorrhage, or mass. Mild chronic microvascular ischemic changes.  MRI cervical spine 01/27/2020: personally reviewed images with patient: No likely significant finding in the cervical region. Ordinary mild degenerative spondylosis from C3-4 through C6-7. No compressive canal stenosis. No primary cord pathology. Mild foraminal narrowing on the right at C4-5, bilaterally at C5-6 and on the left at C6-7.   No proximal intracranial vessel occlusion or significant stenosis.   Review of Systems: Patient complains of symptoms per HPI as well as the following symptoms snoring. Pertinent negatives and positives per HPI. All others negative.   Social History   Socioeconomic History  . Marital status: Married    Spouse name: Not on file  . Number of children: Not on file  . Years of education: Not on file  . Highest education level: Not on file  Occupational History  . Not on file  Tobacco Use  . Smoking status: Never Smoker  . Smokeless tobacco: Never Used  Substance and Sexual Activity  . Alcohol use: Yes    Comment: 2 times per year  . Drug use:  No  . Sexual activity: Not on file  Other Topics Concern  . Not on file  Social History Narrative   Right handed   [place]- 1 cup of coffee per day   Social Determinants of Health   Financial Resource Strain: Not on file  Food Insecurity: Not on file  Transportation Needs: Not on file  Physical Activity: Not on file  Stress: Not on file  Social Connections: Not on file  Intimate Partner  Violence: Not on file    Family History  Problem Relation Age of Onset  . Cancer Mother   . Stroke Father   . Heart attack Father   . Neuropathy Neg Hx     Past Medical History:  Diagnosis Date  . Hand tingling     Patient Active Problem List   Diagnosis Date Noted  . Numbness and tingling in left hand 06/02/2020  . TIA (transient ischemic attack) 02/01/2020  . Left sided numbness 01/26/2020    Past Surgical History:  Procedure Laterality Date  . NO PAST SURGERIES      Current Outpatient Medications  Medication Sig Dispense Refill  . acetaminophen (TYLENOL) 500 MG tablet Take 500-1,000 mg by mouth every 8 (eight) hours as needed for mild pain (or headaches).    . Calcium Carb-Cholecalciferol (CALCIUM+D3 PO) Take 1 tablet by mouth daily.     . ferrous sulfate 325 (65 FE) MG tablet Take 325 mg by mouth daily with breakfast.    . Multiple Vitamins-Minerals (ADULT ONE DAILY GUMMIES) CHEW Chew 1 tablet by mouth daily.    . naproxen sodium (ALEVE) 220 MG tablet Take 220-440 mg by mouth 2 (two) times daily as needed (for headaches or inflammation).     No current facility-administered medications for this visit.    Allergies as of 06/02/2020  . (No Known Allergies)    Vitals: BP 139/74   Pulse 85   Ht 5\' 7"  (1.702 m)   Wt 217 lb (98.4 kg)   BMI 33.99 kg/m  Last Weight:  Wt Readings from Last 1 Encounters:  06/02/20 217 lb (98.4 kg)   Last Height:   Ht Readings from Last 1 Encounters:  06/02/20 5\' 7"  (1.702 m)     Physical exam: Exam: Gen: NAD, conversant, well nourised, obese, well groomed                     CV: RRR, no MRG. No Carotid Bruits. No peripheral edema, warm, nontender Eyes: Conjunctivae clear without exudates or hemorrhage  Neuro: Detailed Neurologic Exam  Speech:    Speech is normal; fluent and spontaneous with normal comprehension.  Cognition:    The patient is oriented to person, place, and time;     recent and remote memory intact;      language fluent;     normal attention, concentration,     fund of knowledge Cranial Nerves:    The pupils are equal, round, and reactive to light. Pupils too small to visualize fundi.  Visual fields are full to finger confrontation. Extraocular movements are intact. Trigeminal sensation is intact and the muscles of mastication are normal. The face is symmetric. The palate elevates in the midline. Hearing intact. Voice is normal. Shoulder shrug is normal. The tongue has normal motion without fasciculations.   Coordination:    No dysmetria or ataxia  Gait:  normal native gait  Motor Observation:    No asymmetry, no atrophy, and no involuntary movements noted. Tone:  Normal muscle tone.    Posture:    Posture is normal. normal erect    Strength:    Strength is V/V in the upper and lower limbs.      Sensation: intact to LT     Reflex Exam:  DTR's:    Deep tendon reflexes in the upper and lower extremities are symmetrical bilaterally.   Toes:    The toes are downgoing bilaterally.   Clonus:    Clonus is absent.    Assessment/Plan:  Lovely patient with numbness and tingling in digits 4-5. Unlikely CTS in this distribution. Ulnar neuropathy vs cervical radiculopathy.  - Physical therapy for cervical radic. Include traction, manual therapy, dry needling as appropriate. - Discussed conservative measures for her elbow, try not to bend, or put elbows on table.  - Return if needed for emg/ncs  No orders of the defined types were placed in this encounter.  No orders of the defined types were placed in this encounter.   Cc: Layne Benton, NP,  Sheliah Hatch, PA-C  Naomie Dean, MD  [facility] [address] [place], [place] [address]  Phone [phone] Fax [phone]

## 2020-06-02 NOTE — Patient Instructions (Addendum)
Physical therapy for neck for cervical radiculopathy Ulnar neuropathy?    Cubital Tunnel Syndrome  Cubital tunnel syndrome is a condition that causes pain and weakness of the forearm and hand. It happens when one of the nerves that runs along the inside of the elbow joint (ulnar nerve) becomes irritated. This condition is usually caused by repeated arm motions that are done during sports or work-related activities. What are the causes? This condition may be caused by:  Increased pressure on the ulnar nerve at the elbow, arm, or forearm. This can result from: ? Irritation caused by repeated elbow bending. ? Poorly healed elbow fractures. ? Tumors in the elbow. These are usually noncancerous (benign). ? Scar tissue that develops in the elbow after an injury. ? Bony growths (spurs) near the ulnar nerve.  Stretching of the nerve due to loose elbow ligaments.  Trauma to the nerve at the elbow. What increases the risk? The following factors may make you more likely to develop this condition:  Doing manual labor that requires frequent bending of the elbow.  Playing sports that include repeated or strenuous throwing motions, such as baseball.  Playing contact sports, such as football or lacrosse.  Not warming up properly before activities.  Having diabetes.  Having an underactive thyroid (hypothyroidism). What are the signs or symptoms? Symptoms of this condition include:  Clumsiness or weakness of the hand.  Tenderness of the inner elbow.  Aching or soreness of the inner elbow, forearm, or fingers, especially the little finger or the ring finger.  Increased pain when forcing the elbow to bend.  Reduced control when throwing objects.  Tingling, numbness, or a burning feeling inside the forearm or in part of the hand or fingers, especially the little finger or the ring finger.  Sharp pains that shoot from the elbow down to the wrist and hand.  The inability to grip or pinch  hard. How is this diagnosed? This condition is diagnosed based on:  Your symptoms and medical history. Your health care provider will also ask for details about any injury.  A physical exam. You may also have tests, including:  Electromyogram (EMG). This test measures electrical signals sent by your nerves into the muscles.  Nerve conduction study. This test measures how well electrical signals pass through your nerves.  Imaging tests, such as X-rays, ultrasound, and MRI. These tests check for possible causes of your condition. How is this treated? This condition may be treated by:  Stopping the activities that are causing your symptoms to get worse.  Icing and taking medicines to reduce pain and swelling.  Wearing a splint to prevent your elbow from bending, or wearing an elbow pad where the ulnar nerve is closest to the skin.  Working with a physical therapist in less severe cases. This may help to: ? Decrease your symptoms. ? Improve the strength and range of motion of your elbow, forearm, and hand. If these treatments do not help, surgery may be needed. Follow these instructions at home: If you have a splint:  Wear the splint as told by your health care provider. Remove it only as told by your health care provider.  Loosen the splint if your fingers tingle, become numb, or turn cold and blue.  Keep the splint clean.  If the splint is not waterproof: ? Do not let it get wet. ? Cover it with a watertight covering when you take a bath or shower. Managing pain, stiffness, and swelling  If directed, put ice  on the injured area: ? Put ice in a plastic bag. ? Place a towel between your skin and the bag. ? Leave the ice on for 20 minutes, 2-3 times a day.  Move your fingers often to avoid stiffness and to lessen swelling.  Raise (elevate) the injured area above the level of your heart while you are sitting or lying down.   General instructions  Take over-the-counter and  prescription medicines only as told by your health care provider.  Do any exercise or physical therapy as told by your health care provider.  Do not drive or use heavy machinery while taking prescription pain medicine.  If you were given an elbow pad, wear it as told by your health care provider.  Keep all follow-up visits as told by your health care provider. This is important. Contact a health care provider if:  Your symptoms get worse.  Your symptoms do not get better with treatment.  You have new pain.  Your hand on the injured side feels numb or cold. Summary  Cubital tunnel syndrome is a condition that causes pain and weakness of the forearm and hand.  You are more likely to develop this condition if you do work or play sports that involve repeated arm movements.  This condition is often treated by stopping repetitive activities, applying ice, and using anti-inflammatory medicines.  In rare cases, surgery may be needed. This information is not intended to replace advice given to you by your health care provider. Make sure you discuss any questions you have with your health care provider. Document Revised: 07/29/2017 Document Reviewed: 07/29/2017 Elsevier Patient Education  2021 [employer]   Cervical Radiculopathy  Cervical radiculopathy happens when a nerve in the neck (a cervical nerve) is pinched or bruised. This condition can happen because of an injury to the cervical spine (vertebrae) in the neck, or as part of the normal aging process. Pressure on the cervical nerves can cause pain or numbness that travels from the neck all the way down into the arm and fingers. Usually, this condition gets better with rest. Treatment may be needed if the condition does not improve. What are the causes? This condition may be caused by:  A neck injury.  A bulging (herniated) disk.  Muscle spasms.  Muscle tightness in the neck because of overuse.  Arthritis.  Breakdown or  degeneration in the bones and joints of the spine (spondylosis) due to aging.  Bone spurs that may develop near the cervical nerves. What are the signs or symptoms? Symptoms of this condition include:  Pain. The pain may travel from the neck to the arm and hand. The pain can be severe or irritating. It may be worse when you move your neck.  Numbness or tingling in your arm or hand.  Weakness in the affected arm and hand, in severe cases. How is this diagnosed? This condition may be diagnosed based on your symptoms, your medical history, and a physical exam. You may also have tests, including:  X-rays.  A CT scan.  An MRI.  An electromyogram (EMG).  Nerve conduction tests. How is this treated? In many cases, treatment is not needed for this condition. With rest, the condition usually gets better over time. If treatment is needed, options may include:  Wearing a soft neck collar (cervical collar) for short periods of time, as told by your health care provider.  Doing physical therapy to strengthen your neck muscles.  Taking medicines, such as NSAIDs or oral  corticosteroids.  Having spinal injections, in severe cases.  Having surgery. This may be needed if other treatments do not help. Different types of surgery may be done depending on the cause of this condition. Follow these instructions at home: If you have a cervical collar:  Wear it as told by your health care provider. Remove it only as told by your health care provider.  Ask your health care provider if you can remove the collar for cleaning and bathing. If you are allowed to remove the collar for cleaning or bathing: ? Follow instructions from your health care provider about how to remove the collar safely. ? Clean the collar by wiping it with mild soap and water and drying it completely. ? Take out any removable pads in the collar every 1-2 days, and wash them by hand with soap and water. Let them air-dry completely  before you put them back in the collar. ? Check your skin under the collar for irritation or sores. If you see any, tell your health care provider. Managing pain  Take over-the-counter and prescription medicines only as told by your health care provider.  If directed, put ice on the affected area. ? If you have a soft neck collar, remove it as told by your health care provider. ? Put ice in a plastic bag. ? Place a towel between your skin and the bag. ? Leave the ice on for 20 minutes, 2-3 times a day.  If applying ice does not help, you can try using heat. Use the heat source that your health care provider recommends, such as a moist heat pack or a heating pad. ? Place a towel between your skin and the heat source. ? Leave the heat on for 20-30 minutes. ? Remove the heat if your skin turns bright red. This is especially important if you are unable to feel pain, heat, or cold. You may have a greater risk of getting burned.  Try a gentle neck and shoulder massage to help relieve symptoms.      Activity  Rest as needed.  Return to your normal activities as told by your health care provider. Ask your health care provider what activities are safe for you.  Do stretching and strengthening exercises as told by your health care provider or physical therapist.  Do not lift anything that is heavier than 10 lb (4.5 kg) until your health care provider tells you that it is safe. General instructions  Use a flat pillow when you sleep.  Do not drive while wearing a cervical collar. If you do not have a cervical collar, ask your health care provider if it is safe to drive while your neck heals.  Ask your health care provider if the medicine prescribed to you requires you to avoid driving or using heavy machinery.  Do not use any products that contain nicotine or tobacco, such as cigarettes, e-cigarettes, and chewing tobacco. These can delay healing. If you need help quitting, ask your health  care provider.  Keep all follow-up visits as told by your health care provider. This is important. Contact a health care provider if:  Your condition does not improve with treatment. Get help right away if:  Your pain gets much worse and cannot be controlled with medicines.  You have weakness or numbness in your hand, arm, face, or leg.  You have a high fever.  You have a stiff, rigid neck.  You lose control of your bowels or your bladder (have incontinence).  You have trouble with walking, balance, or speaking. Summary  Cervical radiculopathy happens when a nerve in the neck is pinched or bruised.  A nerve can get pinched from a bulging disk, arthritis, muscle spasms, or an injury to the neck.  Symptoms include pain, tingling, or numbness radiating from the neck into the arm or hand. Weakness can also occur in severe cases.  Treatment may include rest, wearing a cervical collar, and physical therapy. Medicines may be prescribed to help with pain. In severe cases, injections or surgery may be needed. This information is not intended to replace advice given to you by your health care provider. Make sure you discuss any questions you have with your health care provider. Document Revised: 01/31/2018 Document Reviewed: 01/31/2018 Elsevier Patient Education  2021 [employer].

## 2020-06-08 DIAGNOSIS — Z20822 Contact with and (suspected) exposure to covid-19: Secondary | ICD-10-CM | POA: Diagnosis not present

## 2020-06-08 DIAGNOSIS — Z03818 Encounter for observation for suspected exposure to other biological agents ruled out: Secondary | ICD-10-CM | POA: Diagnosis not present

## 2020-06-27 ENCOUNTER — Ambulatory Visit
Admission: RE | Admit: 2020-06-27 | Discharge: 2020-06-27 | Disposition: A | Discharge status: Home / Self Care | Payer: Federal, State, Local not specified - PPO | Source: Ambulatory Visit | Attending: Family Medicine | Admitting: Family Medicine

## 2020-06-27 ENCOUNTER — Other Ambulatory Visit: Payer: Self-pay

## 2020-06-27 DIAGNOSIS — Z1231 Encounter for screening mammogram for malignant neoplasm of breast: Secondary | ICD-10-CM | POA: Diagnosis not present

## 2020-06-27 IMAGING — MG MM DIGITAL SCREENING BILAT W/ TOMO AND CAD
8 series · 8 of 24 positions shown · non-contrast
Comparison: Previous exam(s).

CLINICAL DATA: Screening.

EXAM:
DIGITAL SCREENING BILATERAL MAMMOGRAM WITH TOMOSYNTHESIS AND CAD
TECHNIQUE: Bilateral screening digital craniocaudal and mediolateral oblique
mammograms were obtained. Bilateral screening digital breast
tomosynthesis was performed. The images were evaluated with
computer-aided detection.

[L MLO synth-2D]
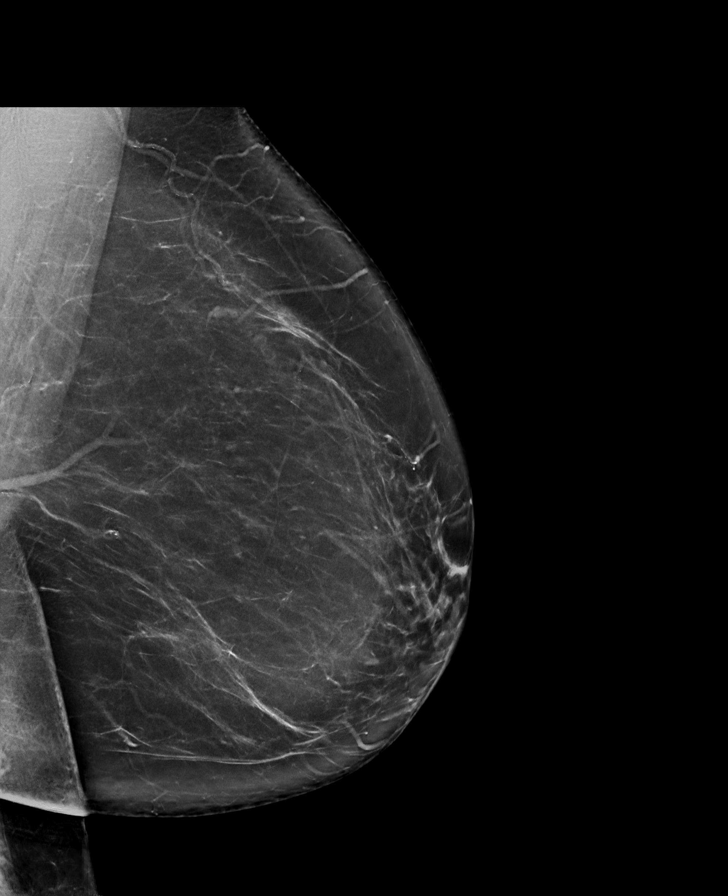

[L CC synth-2D]
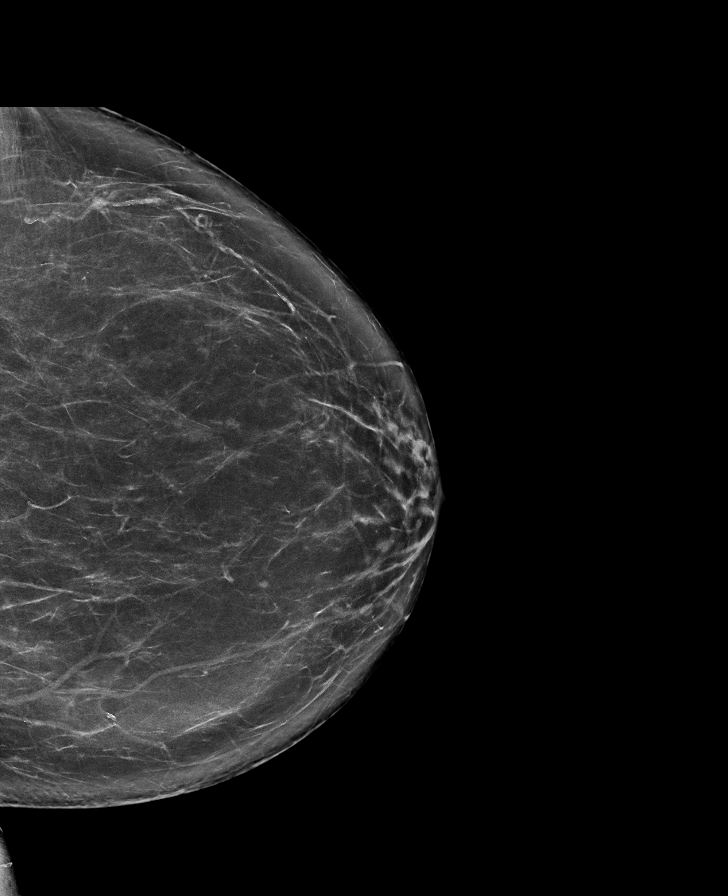

[R MLO synth-2D]
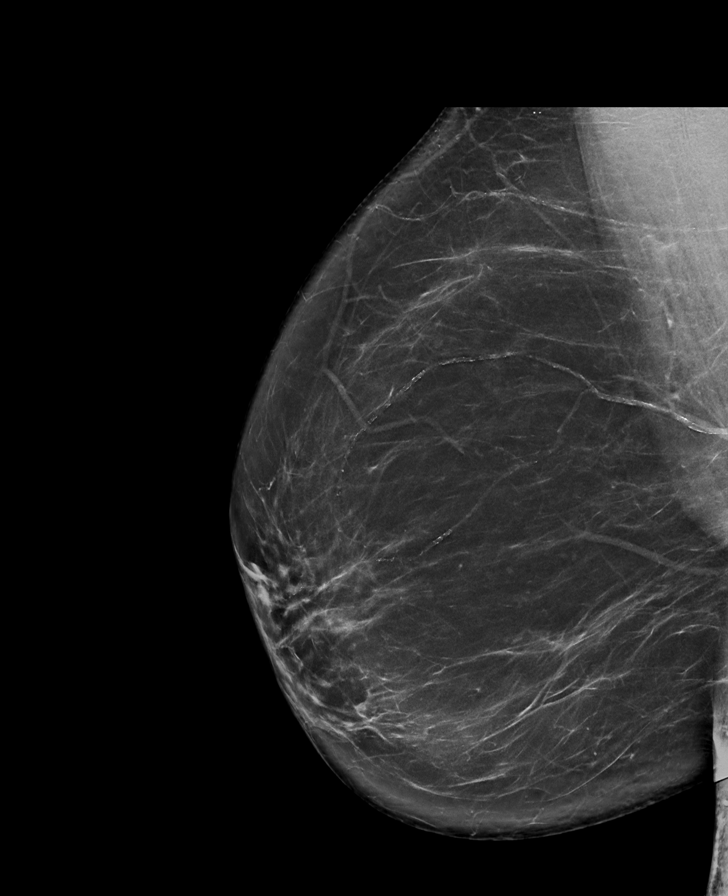

[R CC synth-2D]
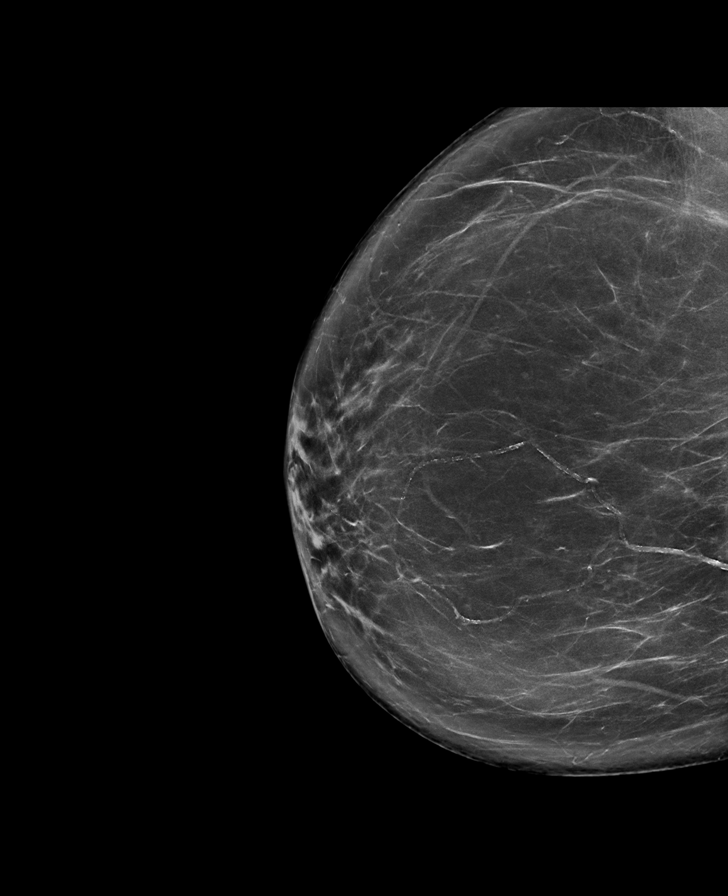

[L MLO tomo · tomo slice 47/92.0]
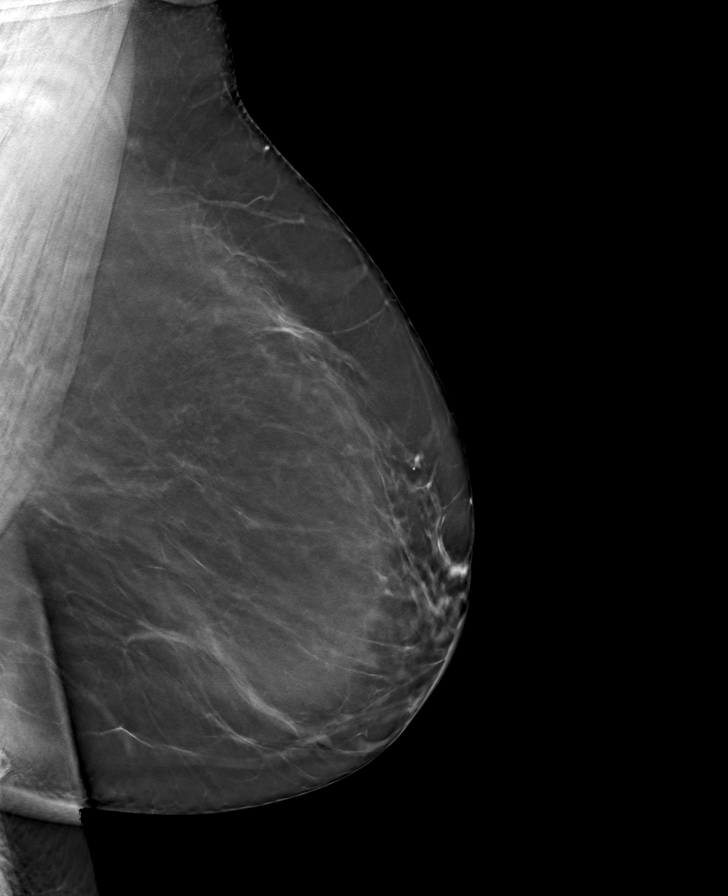

[L CC tomo · tomo slice 41/82.0]
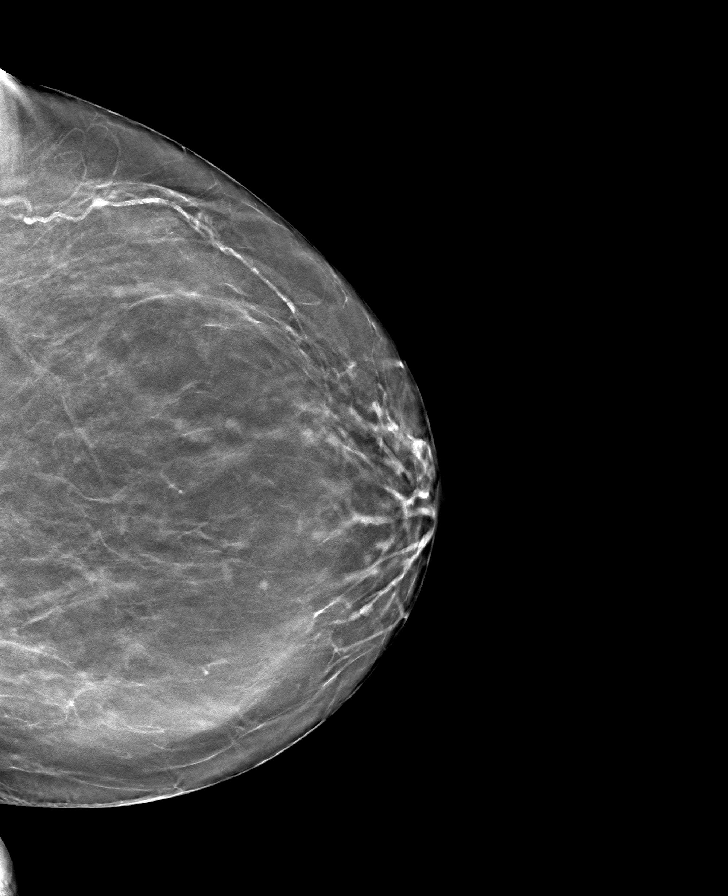

[R MLO tomo · tomo slice 47/92.0]
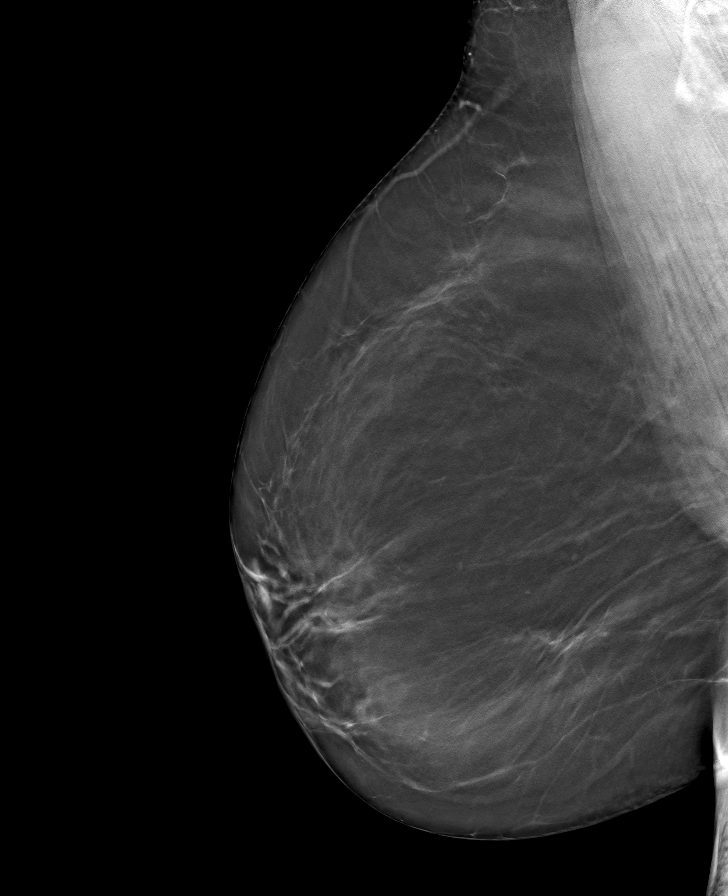

[R CC tomo · tomo slice 45/90.0]
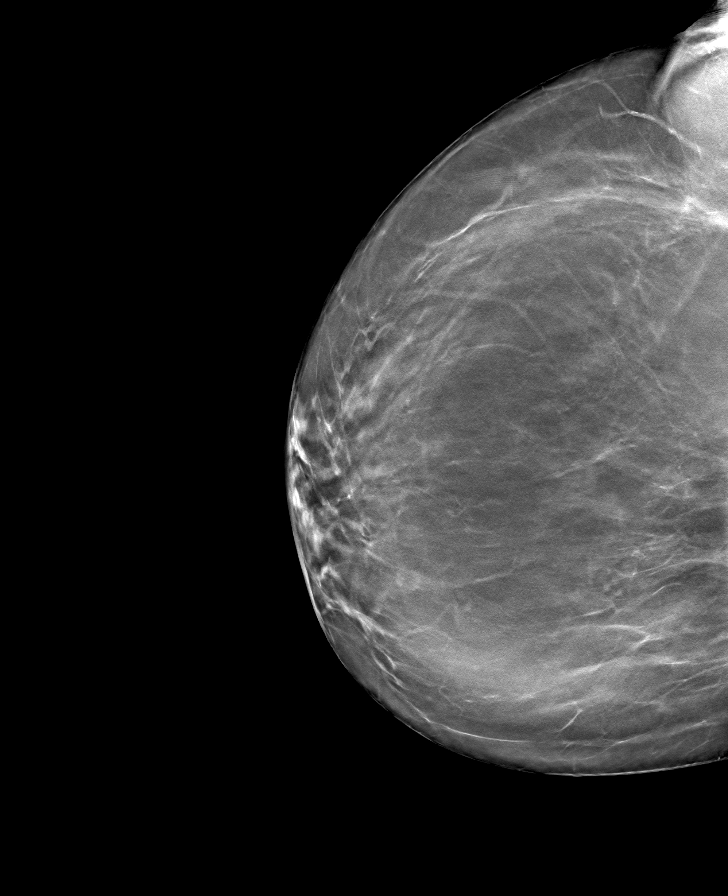

[8 of 24 positions shown; findings below may reference images not displayed]

ACR Breast Density Category b: There are scattered areas of
fibroglandular density.
FINDINGS: There are no findings suspicious for malignancy. The images were
evaluated with computer-aided detection.
IMPRESSION: No mammographic evidence of malignancy. A result letter of this
screening mammogram will be mailed directly to the patient.

RECOMMENDATION:
Screening mammogram in one year. (Code:[OD])

BI-RADS CATEGORY  1: Negative.

## 2020-06-29 ENCOUNTER — Ambulatory Visit: Payer: Federal, State, Local not specified - PPO

## 2020-08-11 DIAGNOSIS — R7303 Prediabetes: Secondary | ICD-10-CM | POA: Diagnosis not present

## 2020-08-11 DIAGNOSIS — B353 Tinea pedis: Secondary | ICD-10-CM | POA: Diagnosis not present

## 2020-08-11 DIAGNOSIS — D569 Thalassemia, unspecified: Secondary | ICD-10-CM | POA: Diagnosis not present

## 2020-08-11 DIAGNOSIS — E0590 Thyrotoxicosis, unspecified without thyrotoxic crisis or storm: Secondary | ICD-10-CM | POA: Diagnosis not present

## 2020-10-16 DIAGNOSIS — Z20822 Contact with and (suspected) exposure to covid-19: Secondary | ICD-10-CM | POA: Diagnosis not present

## 2020-11-29 DIAGNOSIS — Z23 Encounter for immunization: Secondary | ICD-10-CM | POA: Diagnosis not present

## 2021-01-31 DIAGNOSIS — K648 Other hemorrhoids: Secondary | ICD-10-CM | POA: Diagnosis not present

## 2021-01-31 DIAGNOSIS — Z1211 Encounter for screening for malignant neoplasm of colon: Secondary | ICD-10-CM | POA: Diagnosis not present

## 2021-01-31 DIAGNOSIS — D120 Benign neoplasm of cecum: Secondary | ICD-10-CM | POA: Diagnosis not present

## 2021-02-24 DIAGNOSIS — Z0000 Encounter for general adult medical examination without abnormal findings: Secondary | ICD-10-CM | POA: Diagnosis not present

## 2021-02-24 DIAGNOSIS — Z13220 Encounter for screening for lipoid disorders: Secondary | ICD-10-CM | POA: Diagnosis not present

## 2021-02-24 DIAGNOSIS — Z1211 Encounter for screening for malignant neoplasm of colon: Secondary | ICD-10-CM | POA: Diagnosis not present

## 2021-02-24 DIAGNOSIS — R7303 Prediabetes: Secondary | ICD-10-CM | POA: Diagnosis not present

## 2021-02-24 DIAGNOSIS — Z124 Encounter for screening for malignant neoplasm of cervix: Secondary | ICD-10-CM | POA: Diagnosis not present

## 2021-02-24 DIAGNOSIS — Z13820 Encounter for screening for osteoporosis: Secondary | ICD-10-CM | POA: Diagnosis not present

## 2021-02-28 ENCOUNTER — Other Ambulatory Visit: Payer: Self-pay | Admitting: Family Medicine

## 2021-02-28 DIAGNOSIS — Z13820 Encounter for screening for osteoporosis: Secondary | ICD-10-CM

## 2021-03-14 DIAGNOSIS — K080 Exfoliation of teeth due to systemic causes: Secondary | ICD-10-CM | POA: Diagnosis not present

## 2021-07-28 ENCOUNTER — Other Ambulatory Visit: Payer: Federal, State, Local not specified - PPO

## 2021-11-17 DIAGNOSIS — I10 Essential (primary) hypertension: Secondary | ICD-10-CM | POA: Diagnosis not present

## 2021-11-22 DIAGNOSIS — I10 Essential (primary) hypertension: Secondary | ICD-10-CM | POA: Diagnosis not present

## 2021-12-06 ENCOUNTER — Ambulatory Visit
Admission: RE | Admit: 2021-12-06 | Discharge: 2021-12-06 | Disposition: A | Discharge status: Home / Self Care | Payer: Federal, State, Local not specified - PPO | Source: Ambulatory Visit | Attending: Family Medicine | Admitting: Family Medicine

## 2021-12-06 DIAGNOSIS — Z13820 Encounter for screening for osteoporosis: Secondary | ICD-10-CM

## 2021-12-06 DIAGNOSIS — M85852 Other specified disorders of bone density and structure, left thigh: Secondary | ICD-10-CM | POA: Diagnosis not present

## 2021-12-06 DIAGNOSIS — Z780 Asymptomatic menopausal state: Secondary | ICD-10-CM | POA: Diagnosis not present

## 2022-03-06 DIAGNOSIS — E7800 Pure hypercholesterolemia, unspecified: Secondary | ICD-10-CM | POA: Diagnosis not present

## 2022-03-06 DIAGNOSIS — Z23 Encounter for immunization: Secondary | ICD-10-CM | POA: Diagnosis not present

## 2022-03-06 DIAGNOSIS — E0590 Thyrotoxicosis, unspecified without thyrotoxic crisis or storm: Secondary | ICD-10-CM | POA: Diagnosis not present

## 2022-03-06 DIAGNOSIS — Z0000 Encounter for general adult medical examination without abnormal findings: Secondary | ICD-10-CM | POA: Diagnosis not present

## 2022-03-06 DIAGNOSIS — D569 Thalassemia, unspecified: Secondary | ICD-10-CM | POA: Diagnosis not present

## 2022-03-06 DIAGNOSIS — Z1231 Encounter for screening mammogram for malignant neoplasm of breast: Secondary | ICD-10-CM | POA: Diagnosis not present

## 2022-03-06 DIAGNOSIS — Z124 Encounter for screening for malignant neoplasm of cervix: Secondary | ICD-10-CM | POA: Diagnosis not present

## 2022-03-06 DIAGNOSIS — Z1211 Encounter for screening for malignant neoplasm of colon: Secondary | ICD-10-CM | POA: Diagnosis not present

## 2022-03-06 DIAGNOSIS — E1169 Type 2 diabetes mellitus with other specified complication: Secondary | ICD-10-CM | POA: Diagnosis not present

## 2022-05-25 DIAGNOSIS — N390 Urinary tract infection, site not specified: Secondary | ICD-10-CM | POA: Diagnosis not present

## 2022-05-30 DIAGNOSIS — I10 Essential (primary) hypertension: Secondary | ICD-10-CM | POA: Diagnosis not present

## 2022-06-13 DIAGNOSIS — R300 Dysuria: Secondary | ICD-10-CM | POA: Diagnosis not present

## 2022-09-05 DIAGNOSIS — E7800 Pure hypercholesterolemia, unspecified: Secondary | ICD-10-CM | POA: Diagnosis not present

## 2022-09-05 DIAGNOSIS — R7989 Other specified abnormal findings of blood chemistry: Secondary | ICD-10-CM | POA: Diagnosis not present

## 2022-09-05 DIAGNOSIS — D569 Thalassemia, unspecified: Secondary | ICD-10-CM | POA: Diagnosis not present

## 2022-09-05 DIAGNOSIS — R946 Abnormal results of thyroid function studies: Secondary | ICD-10-CM | POA: Diagnosis not present

## 2022-09-05 DIAGNOSIS — E1169 Type 2 diabetes mellitus with other specified complication: Secondary | ICD-10-CM | POA: Diagnosis not present

## 2023-01-09 DIAGNOSIS — I10 Essential (primary) hypertension: Secondary | ICD-10-CM | POA: Diagnosis not present

## 2023-01-09 DIAGNOSIS — E669 Obesity, unspecified: Secondary | ICD-10-CM | POA: Diagnosis not present

## 2023-05-22 DIAGNOSIS — E669 Obesity, unspecified: Secondary | ICD-10-CM | POA: Diagnosis not present

## 2023-05-22 DIAGNOSIS — I10 Essential (primary) hypertension: Secondary | ICD-10-CM | POA: Diagnosis not present

## 2023-06-04 DIAGNOSIS — R946 Abnormal results of thyroid function studies: Secondary | ICD-10-CM | POA: Diagnosis not present

## 2023-06-04 DIAGNOSIS — E7800 Pure hypercholesterolemia, unspecified: Secondary | ICD-10-CM | POA: Diagnosis not present

## 2023-06-04 DIAGNOSIS — D569 Thalassemia, unspecified: Secondary | ICD-10-CM | POA: Diagnosis not present

## 2023-06-04 DIAGNOSIS — E0590 Thyrotoxicosis, unspecified without thyrotoxic crisis or storm: Secondary | ICD-10-CM | POA: Diagnosis not present

## 2023-06-04 DIAGNOSIS — E1169 Type 2 diabetes mellitus with other specified complication: Secondary | ICD-10-CM | POA: Diagnosis not present

## 2023-06-04 DIAGNOSIS — D509 Iron deficiency anemia, unspecified: Secondary | ICD-10-CM | POA: Diagnosis not present

## 2023-06-04 DIAGNOSIS — Z23 Encounter for immunization: Secondary | ICD-10-CM | POA: Diagnosis not present

## 2023-06-04 DIAGNOSIS — Z0000 Encounter for general adult medical examination without abnormal findings: Secondary | ICD-10-CM | POA: Diagnosis not present

## 2023-06-06 ENCOUNTER — Other Ambulatory Visit: Payer: Self-pay | Admitting: Family Medicine

## 2023-06-06 DIAGNOSIS — Z1231 Encounter for screening mammogram for malignant neoplasm of breast: Secondary | ICD-10-CM

## 2023-06-10 DIAGNOSIS — K080 Exfoliation of teeth due to systemic causes: Secondary | ICD-10-CM | POA: Diagnosis not present

## 2023-06-17 ENCOUNTER — Ambulatory Visit
Admission: RE | Admit: 2023-06-17 | Discharge: 2023-06-17 | Disposition: A | Discharge status: Home / Self Care | Source: Ambulatory Visit | Attending: Family Medicine | Admitting: Family Medicine

## 2023-06-17 DIAGNOSIS — Z1231 Encounter for screening mammogram for malignant neoplasm of breast: Secondary | ICD-10-CM

## 2023-07-05 DIAGNOSIS — L2089 Other atopic dermatitis: Secondary | ICD-10-CM | POA: Diagnosis not present

## 2023-11-08 DIAGNOSIS — I10 Essential (primary) hypertension: Secondary | ICD-10-CM | POA: Diagnosis not present

## 2024-01-23 DIAGNOSIS — Z23 Encounter for immunization: Secondary | ICD-10-CM | POA: Diagnosis not present
